# Patient Record
Sex: Female | Born: 1965 | Race: Black or African American | Hispanic: No | Marital: Married | Smoking: Never smoker
Health system: Southern US, Community
[De-identification: ages and names within clinical notes are randomized; demographics above are authoritative.]

## PROBLEM LIST (undated history)

## (undated) DIAGNOSIS — I10 Essential (primary) hypertension: Secondary | ICD-10-CM

## (undated) DIAGNOSIS — T7840XA Allergy, unspecified, initial encounter: Secondary | ICD-10-CM

## (undated) DIAGNOSIS — T8859XA Other complications of anesthesia, initial encounter: Secondary | ICD-10-CM

## (undated) DIAGNOSIS — D649 Anemia, unspecified: Secondary | ICD-10-CM

## (undated) HISTORY — PX: TUBAL LIGATION: SHX77

## (undated) HISTORY — DX: Anemia, unspecified: D64.9

## (undated) HISTORY — DX: Essential (primary) hypertension: I10

## (undated) HISTORY — PX: ROTATOR CUFF REPAIR: SHX139

## (undated) HISTORY — DX: Allergy, unspecified, initial encounter: T78.40XA

## (undated) HISTORY — PX: EYE SURGERY: SHX253

---

## 2003-02-09 ENCOUNTER — Ambulatory Visit (HOSPITAL_COMMUNITY): Admission: RE | Admit: 2003-02-09 | Discharge: 2003-02-09 | Payer: Self-pay | Admitting: Obstetrics & Gynecology

## 2005-03-22 ENCOUNTER — Ambulatory Visit: Payer: Self-pay | Admitting: Family Medicine

## 2007-02-26 ENCOUNTER — Ambulatory Visit (HOSPITAL_COMMUNITY): Admission: RE | Admit: 2007-02-26 | Discharge: 2007-02-26 | Payer: Self-pay | Admitting: Obstetrics & Gynecology

## 2008-02-04 ENCOUNTER — Emergency Department (HOSPITAL_COMMUNITY): Admission: EM | Admit: 2008-02-04 | Discharge: 2008-02-04 | Payer: Self-pay | Admitting: Emergency Medicine

## 2008-03-05 ENCOUNTER — Other Ambulatory Visit: Admission: RE | Admit: 2008-03-05 | Discharge: 2008-03-05 | Payer: Self-pay | Admitting: Obstetrics & Gynecology

## 2008-03-05 ENCOUNTER — Ambulatory Visit (HOSPITAL_COMMUNITY): Admission: RE | Admit: 2008-03-05 | Discharge: 2008-03-05 | Payer: Self-pay | Admitting: Obstetrics & Gynecology

## 2009-03-09 ENCOUNTER — Ambulatory Visit (HOSPITAL_COMMUNITY): Admission: RE | Admit: 2009-03-09 | Discharge: 2009-03-09 | Payer: Self-pay | Admitting: Obstetrics & Gynecology

## 2009-03-10 ENCOUNTER — Other Ambulatory Visit: Admission: RE | Admit: 2009-03-10 | Discharge: 2009-03-10 | Payer: Self-pay | Admitting: Obstetrics & Gynecology

## 2009-03-24 ENCOUNTER — Emergency Department (HOSPITAL_COMMUNITY): Admission: EM | Admit: 2009-03-24 | Discharge: 2009-03-24 | Payer: Self-pay | Admitting: Emergency Medicine

## 2010-03-14 ENCOUNTER — Ambulatory Visit (HOSPITAL_COMMUNITY): Admission: RE | Admit: 2010-03-14 | Discharge: 2010-03-14 | Payer: Self-pay | Admitting: Obstetrics & Gynecology

## 2010-04-06 ENCOUNTER — Other Ambulatory Visit: Admission: RE | Admit: 2010-04-06 | Discharge: 2010-04-06 | Payer: Self-pay | Admitting: Obstetrics & Gynecology

## 2011-03-02 ENCOUNTER — Other Ambulatory Visit: Payer: Self-pay | Admitting: Obstetrics & Gynecology

## 2011-03-02 DIAGNOSIS — Z139 Encounter for screening, unspecified: Secondary | ICD-10-CM

## 2011-03-02 NOTE — Op Note (Signed)
   NAME:  Rachel Pearson, Rachel Pearson                           ACCOUNT NO.:  [id number]   MEDICAL RECORD NO.:  [id number]                   PATIENT TYPE:  AMB   LOCATION:  DAY                                  FACILITY:  [facility]   PHYSICIAN:  Lazaro Arms, M.D.                DATE OF BIRTH:  09-10-1966   DATE OF PROCEDURE:  02/09/2003  DATE OF DISCHARGE:                                 OPERATIVE REPORT   PREOPERATIVE DIAGNOSES:  1. Multiparous female desires permanent sterilization.  2. Last child 61 years old.   POSTOPERATIVE DIAGNOSES:  1. Multiparous female desires permanent sterilization.  2. Last child 45 years old.   PROCEDURE:  Laparoscopic bilateral tubal ligation using electrocautery.   SURGEON:  Lazaro Arms, M.D.   ANESTHESIA:  General endotracheal   FINDINGS:  The patient had a normal uterus, tubes and ovaries, and  peritoneal cavity.   DESCRIPTION OF OPERATION:  The patient was taken to the operating room and  placed in the supine position where she underwent general endotracheal  anesthesia.  She was then placed in dorsal lithotomy position.  The vagina  was prepped and draped in the usual sterile fashion.  An in-and-out catheter  was performed.  A Hulka tenaculum was placed for uterine manipulation.   An incision was made in the umbilicus and dissected to the rectus fascia.  The rectus fascia was incised with the scissors, and manually entered the  peritoneum without difficulty under direct visualization.  The 10-11 trocar  was placed.  The peritoneal cavity was insufflated and confirmed with video  laparoscope.  Both tubes were identified, burned, and the distal isthmic and  ampullary region of each tube, to no resistance and beyond using  electrocautery unit.  The patient tolerated the procedure well. There was no  blood loss.  The instruments were removed.  The gas was allowed to escape.  The fascia was closed with a single #0 Vicryl suture and a 3-0 subcutaneous  suture was placed and the skin was closed using Dermabond.  The patient  tolerated the procedure well.  She experienced no blood loss, and was taken  to recovery in good stable condition.  All counts were correct x3.                                               Lazaro Arms, M.D.    Loraine Maple  D:  02/09/2003  T:  02/09/2003  Job:  [id number]

## 2011-03-19 ENCOUNTER — Ambulatory Visit (HOSPITAL_COMMUNITY)
Admission: RE | Admit: 2011-03-19 | Discharge: 2011-03-19 | Disposition: A | Discharge status: Home / Self Care | Payer: PRIVATE HEALTH INSURANCE | Source: Ambulatory Visit | Attending: Obstetrics & Gynecology | Admitting: Obstetrics & Gynecology

## 2011-03-19 DIAGNOSIS — Z1231 Encounter for screening mammogram for malignant neoplasm of breast: Secondary | ICD-10-CM | POA: Insufficient documentation

## 2011-03-19 DIAGNOSIS — Z139 Encounter for screening, unspecified: Secondary | ICD-10-CM

## 2011-05-14 ENCOUNTER — Other Ambulatory Visit: Payer: Self-pay | Admitting: Obstetrics & Gynecology

## 2011-05-14 ENCOUNTER — Other Ambulatory Visit (HOSPITAL_COMMUNITY)
Admission: RE | Admit: 2011-05-14 | Discharge: 2011-05-14 | Disposition: A | Discharge status: Home / Self Care | Payer: PRIVATE HEALTH INSURANCE | Source: Ambulatory Visit | Attending: Obstetrics & Gynecology | Admitting: Obstetrics & Gynecology

## 2011-05-14 DIAGNOSIS — Z01419 Encounter for gynecological examination (general) (routine) without abnormal findings: Secondary | ICD-10-CM | POA: Insufficient documentation

## 2012-02-11 ENCOUNTER — Other Ambulatory Visit: Payer: Self-pay | Admitting: Obstetrics & Gynecology

## 2012-02-11 DIAGNOSIS — Z139 Encounter for screening, unspecified: Secondary | ICD-10-CM

## 2012-03-20 ENCOUNTER — Ambulatory Visit (HOSPITAL_COMMUNITY)
Admission: RE | Admit: 2012-03-20 | Discharge: 2012-03-20 | Disposition: A | Discharge status: Home / Self Care | Payer: PRIVATE HEALTH INSURANCE | Source: Ambulatory Visit | Attending: Obstetrics & Gynecology | Admitting: Obstetrics & Gynecology

## 2012-03-20 DIAGNOSIS — Z1231 Encounter for screening mammogram for malignant neoplasm of breast: Secondary | ICD-10-CM | POA: Insufficient documentation

## 2012-03-20 DIAGNOSIS — Z139 Encounter for screening, unspecified: Secondary | ICD-10-CM

## 2012-06-09 ENCOUNTER — Other Ambulatory Visit: Payer: Self-pay | Admitting: Obstetrics & Gynecology

## 2012-06-09 ENCOUNTER — Other Ambulatory Visit (HOSPITAL_COMMUNITY)
Admission: RE | Admit: 2012-06-09 | Discharge: 2012-06-09 | Disposition: A | Discharge status: Home / Self Care | Payer: PRIVATE HEALTH INSURANCE | Source: Ambulatory Visit | Attending: Obstetrics & Gynecology | Admitting: Obstetrics & Gynecology

## 2012-06-09 DIAGNOSIS — Z01419 Encounter for gynecological examination (general) (routine) without abnormal findings: Secondary | ICD-10-CM | POA: Insufficient documentation

## 2013-05-18 ENCOUNTER — Other Ambulatory Visit: Payer: Self-pay | Admitting: Obstetrics & Gynecology

## 2013-05-18 DIAGNOSIS — Z139 Encounter for screening, unspecified: Secondary | ICD-10-CM

## 2013-05-28 ENCOUNTER — Ambulatory Visit (HOSPITAL_COMMUNITY)
Admission: RE | Admit: 2013-05-28 | Discharge: 2013-05-28 | Disposition: A | Discharge status: Home / Self Care | Payer: 59 | Source: Ambulatory Visit | Attending: Obstetrics & Gynecology | Admitting: Obstetrics & Gynecology

## 2013-05-28 DIAGNOSIS — Z1231 Encounter for screening mammogram for malignant neoplasm of breast: Secondary | ICD-10-CM | POA: Insufficient documentation

## 2013-05-28 DIAGNOSIS — Z139 Encounter for screening, unspecified: Secondary | ICD-10-CM

## 2013-06-10 ENCOUNTER — Other Ambulatory Visit: Payer: Self-pay | Admitting: Obstetrics & Gynecology

## 2013-07-09 ENCOUNTER — Other Ambulatory Visit (HOSPITAL_COMMUNITY)
Admission: RE | Admit: 2013-07-09 | Discharge: 2013-07-09 | Disposition: A | Discharge status: Home / Self Care | Payer: 59 | Source: Ambulatory Visit | Attending: Obstetrics & Gynecology | Admitting: Obstetrics & Gynecology

## 2013-07-09 ENCOUNTER — Ambulatory Visit (INDEPENDENT_AMBULATORY_CARE_PROVIDER_SITE_OTHER): Payer: 59 | Admitting: Obstetrics & Gynecology

## 2013-07-09 ENCOUNTER — Encounter: Payer: Self-pay | Admitting: Obstetrics & Gynecology

## 2013-07-09 VITALS — BP 110/70 | Ht 63.0 in | Wt 231.0 lb

## 2013-07-09 DIAGNOSIS — I10 Essential (primary) hypertension: Secondary | ICD-10-CM | POA: Insufficient documentation

## 2013-07-09 DIAGNOSIS — Z01419 Encounter for gynecological examination (general) (routine) without abnormal findings: Secondary | ICD-10-CM | POA: Insufficient documentation

## 2013-07-09 DIAGNOSIS — Z1151 Encounter for screening for human papillomavirus (HPV): Secondary | ICD-10-CM | POA: Insufficient documentation

## 2013-07-09 NOTE — Progress Notes (Signed)
Patient ID: Rachel Pearson, female   DOB: [birth year], 47 y.o.   MRN: [id number] Subjective:     Rachel Pearson is a 47 y.o. female here for a routine exam.  Patient's last menstrual period was 06/29/2013. No obstetric history on file. Current complaints: none.  Personal health questionnaire reviewed: no.   Gynecologic History Patient's last menstrual period was 06/29/2013. Contraception: tubal ligation Last Pap: 2013. Results were: normal Last mammogram: 2014. Results were: normal  Past Medical History  Diagnosis Date  . Allergy   . Hypertension   . Anemia     Past Surgical History  Procedure Laterality Date  . Tubal ligation      OB History   Grav Para Term Preterm Abortions TAB SAB Ect Mult Living                  History   Social History  . Marital Status: Married    Spouse Name: N/A    Number of Children: N/A  . Years of Education: N/A   Social History Main Topics  . Smoking status: Never Smoker   . Smokeless tobacco: None  . Alcohol Use: None  . Drug Use: None  . Sexual Activity: Yes   Other Topics Concern  . None   Social History Narrative  . None    Family History  Problem Relation Age of Onset  . Leukemia Mother   . Diabetes Mother   . Hypertension Mother   . Diabetes Father   . Hypertension Father   . Thyroid disease Sister   . Hypertension Brother   . Hypertension Brother      Review of Systems  Review of Systems  Constitutional: Negative for fever, chills, weight loss, malaise/fatigue and diaphoresis.  HENT: Negative for hearing loss, ear pain, nosebleeds, congestion, sore throat, neck pain, tinnitus and ear discharge.   Eyes: Negative for blurred vision, double vision, photophobia, pain, discharge and redness.  Respiratory: Negative for cough, hemoptysis, sputum production, shortness of breath, wheezing and stridor.   Cardiovascular: Negative for chest pain, palpitations, orthopnea, claudication, leg swelling and PND.   Gastrointestinal: negative for abdominal pain. Negative for heartburn, nausea, vomiting, diarrhea, constipation, blood in stool and melena.  Genitourinary: Negative for dysuria, urgency, frequency, hematuria and flank pain.  Musculoskeletal: Negative for myalgias, back pain, joint pain and falls.  Skin: Negative for itching and rash.  Neurological: Negative for dizziness, tingling, tremors, sensory change, speech change, focal weakness, seizures, loss of consciousness, weakness and headaches.  Endo/Heme/Allergies: Negative for environmental allergies and polydipsia. Does not bruise/bleed easily.  Psychiatric/Behavioral: Negative for depression, suicidal ideas, hallucinations, memory loss and substance abuse. The patient is not nervous/anxious and does not have insomnia.        Objective:    Physical Exam  Vitals reviewed. Constitutional: She is oriented to person, place, and time. She appears well-developed and well-nourished.  HENT:  Head: Normocephalic and atraumatic.        Right Ear: External ear normal.  Left Ear: External ear normal.  Nose: Nose normal.  Mouth/Throat: Oropharynx is clear and moist.  Eyes: Conjunctivae and EOM are normal. Pupils are equal, round, and reactive to light. Right eye exhibits no discharge. Left eye exhibits no discharge. No scleral icterus.  Neck: Normal range of motion. Neck supple. No tracheal deviation present. No thyromegaly present.  Cardiovascular: Normal rate, regular rhythm, normal heart sounds and intact distal pulses.  Exam reveals no gallop and no friction rub.   No murmur heard.  Respiratory: Effort normal and breath sounds normal. No respiratory distress. She has no wheezes. She has no rales. She exhibits no tenderness.  GI: Soft. Bowel sounds are normal. She exhibits no distension and no mass. There is no tenderness. There is no rebound and no guarding.  Genitourinary:  Breasts no masses skin changes or nipple changes bilaterally       Vulva is normal without lesions Vagina is pink moist without discharge Cervix normal in appearance and pap is done Uterus is normal size shape and contour Adnexa is negative with normal sized ovaries  Rectal   Not done  Musculoskeletal: Normal range of motion. She exhibits no edema and no tenderness.  Neurological: She is alert and oriented to person, place, and time. She has normal reflexes. She displays normal reflexes. No cranial nerve deficit. She exhibits normal muscle tone. Coordination normal.  Skin: Skin is warm and dry. No rash noted. No erythema. No pallor.  Psychiatric: She has a normal mood and affect. Her behavior is normal. Judgment and thought content normal.       Assessment:    Healthy female exam.    Plan:    Follow up in: 1 year.

## 2014-05-18 ENCOUNTER — Other Ambulatory Visit: Payer: Self-pay | Admitting: Obstetrics & Gynecology

## 2014-05-18 DIAGNOSIS — Z1231 Encounter for screening mammogram for malignant neoplasm of breast: Secondary | ICD-10-CM

## 2014-05-31 ENCOUNTER — Ambulatory Visit (HOSPITAL_COMMUNITY)
Admission: RE | Admit: 2014-05-31 | Discharge: 2014-05-31 | Disposition: A | Discharge status: Home / Self Care | Payer: 59 | Source: Ambulatory Visit | Attending: Obstetrics & Gynecology | Admitting: Obstetrics & Gynecology

## 2014-05-31 DIAGNOSIS — Z1231 Encounter for screening mammogram for malignant neoplasm of breast: Secondary | ICD-10-CM

## 2014-07-22 ENCOUNTER — Other Ambulatory Visit: Payer: 59 | Admitting: Obstetrics & Gynecology

## 2014-08-04 ENCOUNTER — Other Ambulatory Visit: Payer: 59 | Admitting: Obstetrics & Gynecology

## 2014-08-27 ENCOUNTER — Ambulatory Visit (INDEPENDENT_AMBULATORY_CARE_PROVIDER_SITE_OTHER): Payer: 59 | Admitting: Obstetrics & Gynecology

## 2014-08-27 ENCOUNTER — Other Ambulatory Visit (HOSPITAL_COMMUNITY)
Admission: RE | Admit: 2014-08-27 | Discharge: 2014-08-27 | Disposition: A | Discharge status: Home / Self Care | Payer: 59 | Source: Ambulatory Visit | Attending: Obstetrics & Gynecology | Admitting: Obstetrics & Gynecology

## 2014-08-27 ENCOUNTER — Encounter: Payer: Self-pay | Admitting: Obstetrics & Gynecology

## 2014-08-27 VITALS — BP 120/80 | Ht 63.0 in | Wt 231.0 lb

## 2014-08-27 DIAGNOSIS — Z01419 Encounter for gynecological examination (general) (routine) without abnormal findings: Secondary | ICD-10-CM | POA: Diagnosis present

## 2014-08-27 NOTE — Progress Notes (Signed)
Patient ID: Rachel Pearson, female   DOB: Feb 23, 1966, 48 y.o.   MRN: [id number] Subjective:     Rachel Pearson is a 48 y.o. female here for a routine exam.  Patient's last menstrual period was 08/05/2014. No obstetric history on file. Birth Control Method:  BTL Menstrual Calendar(currently): regular 7 days heavy  Current complaints: heavy periods, anemia secondarily.   Current acute medical issues:  Anemia, Fe deficiency   Recent Gynecologic History Patient's last menstrual period was 08/05/2014. Last Pap: 2014,  normal Last mammogram: 8.2015,  normal  Past Medical History  Diagnosis Date  . Allergy   . Hypertension   . Anemia     Past Surgical History  Procedure Laterality Date  . Tubal ligation      OB History    No data available      History   Social History  . Marital Status: Married    Spouse Name: N/A    Number of Children: N/A  . Years of Education: N/A   Social History Main Topics  . Smoking status: Never Smoker   . Smokeless tobacco: None  . Alcohol Use: None  . Drug Use: None  . Sexual Activity: Yes   Other Topics Concern  . None   Social History Narrative    Family History  Problem Relation Age of Onset  . Leukemia Mother   . Diabetes Mother   . Hypertension Mother   . Diabetes Father   . Hypertension Father   . Thyroid disease Sister   . Hypertension Brother   . Hypertension Brother     Current outpatient prescriptions: Cholecalciferol (VITAMIN D-3) 5000 UNITS TABS, Take by mouth., Disp: , Rfl: ;  ferrous fumarate (FERRO-SEQUELS) 50 MG CR tablet, Take 65 mg by mouth 2 (two) times daily between meals. , Disp: , Rfl: ;  fluticasone (FLONASE) 50 MCG/ACT nasal spray, Place 2 sprays into the nose daily., Disp: , Rfl: ;  losartan-hydrochlorothiazide (HYZAAR) 50-12.5 MG per tablet, Take 1 tablet by mouth daily., Disp: , Rfl:   Review of Systems  Review of Systems  Constitutional: Negative for fever, chills, weight loss, malaise/fatigue and  diaphoresis.  HENT: Negative for hearing loss, ear pain, nosebleeds, congestion, sore throat, neck pain, tinnitus and ear discharge.   Eyes: Negative for blurred vision, double vision, photophobia, pain, discharge and redness.  Respiratory: Negative for cough, hemoptysis, sputum production, shortness of breath, wheezing and stridor.   Cardiovascular: Negative for chest pain, palpitations, orthopnea, claudication, leg swelling and PND.  Gastrointestinal: negative for abdominal pain. Negative for heartburn, nausea, vomiting, diarrhea, constipation, blood in stool and melena.  Genitourinary: Negative for dysuria, urgency, frequency, hematuria and flank pain.  Musculoskeletal: Negative for myalgias, back pain, joint pain and falls.  Skin: Negative for itching and rash.  Neurological: Negative for dizziness, tingling, tremors, sensory change, speech change, focal weakness, seizures, loss of consciousness, weakness and headaches.  Endo/Heme/Allergies: Negative for environmental allergies and polydipsia. Does not bruise/bleed easily.  Psychiatric/Behavioral: Negative for depression, suicidal ideas, hallucinations, memory loss and substance abuse. The patient is not nervous/anxious and does not have insomnia.        Objective:  Blood pressure 120/80, height 5\' 3"  (1.6 m), weight 231 lb (104.781 kg), last menstrual period 08/05/2014.   Physical Exam  Vitals reviewed. Constitutional: She is oriented to person, place, and time. She appears well-developed and well-nourished.  HENT:  Head: Normocephalic and atraumatic.        Right Ear: External ear normal.  Left Ear: External ear normal.  Nose: Nose normal.  Mouth/Throat: Oropharynx is clear and moist.  Eyes: Conjunctivae and EOM are normal. Pupils are equal, round, and reactive to light. Right eye exhibits no discharge. Left eye exhibits no discharge. No scleral icterus.  Neck: Normal range of motion. Neck supple. No tracheal deviation present. No  thyromegaly present.  Cardiovascular: Normal rate, regular rhythm, normal heart sounds and intact distal pulses.  Exam reveals no gallop and no friction rub.   No murmur heard. Respiratory: Effort normal and breath sounds normal. No respiratory distress. She has no wheezes. She has no rales. She exhibits no tenderness.  GI: Soft. Bowel sounds are normal. She exhibits no distension and no mass. There is no tenderness. There is no rebound and no guarding.  Genitourinary:  Breasts no masses skin changes or nipple changes bilaterally      Vulva is normal without lesions Vagina is pink moist without discharge Cervix normal in appearance and pap is done Uterus is normal size shape and contour Adnexa is negative with normal sized ovaries   Musculoskeletal: Normal range of motion. She exhibits no edema and no tenderness.  Neurological: She is alert and oriented to person, place, and time. She has normal reflexes. She displays normal reflexes. No cranial nerve deficit. She exhibits normal muscle tone. Coordination normal.  Skin: Skin is warm and dry. No rash noted. No erythema. No pallor.  Psychiatric: She has a normal mood and affect. Her behavior is normal. Judgment and thought content normal.       Assessment:    Healthy female exam.   Menorrhagia anemia   Plan:    Follow up in: 3 weeks. IUD placement

## 2014-08-31 LAB — CYTOLOGY - PAP

## 2014-09-16 ENCOUNTER — Ambulatory Visit: Payer: 59 | Admitting: Obstetrics & Gynecology

## 2014-09-30 ENCOUNTER — Encounter: Payer: Self-pay | Admitting: Obstetrics & Gynecology

## 2014-09-30 ENCOUNTER — Ambulatory Visit (INDEPENDENT_AMBULATORY_CARE_PROVIDER_SITE_OTHER): Payer: 59 | Admitting: Obstetrics & Gynecology

## 2014-09-30 VITALS — BP 148/70 | Wt 236.0 lb

## 2014-09-30 DIAGNOSIS — N946 Dysmenorrhea, unspecified: Secondary | ICD-10-CM

## 2014-09-30 DIAGNOSIS — Z3202 Encounter for pregnancy test, result negative: Secondary | ICD-10-CM

## 2014-09-30 DIAGNOSIS — N920 Excessive and frequent menstruation with regular cycle: Secondary | ICD-10-CM

## 2014-09-30 LAB — POCT URINE PREGNANCY: Preg Test, Ur: NEGATIVE

## 2014-09-30 NOTE — Progress Notes (Signed)
Patient ID: Rachel Pearson, female   DOB: 12/08/1965, 48 y.o.   MRN: [id number] 48 y/o with heavy menstrual periods 7 days and associated pain S/P BTL for contraception levonorgestrel IUD placement to try to control menses  IUD Insertion Procedure Note  Pre-operative Diagnosis: menorrhagia, dysmenorrhea  Post-operative Diagnosis: same  Indications: menorrhagia, dysmenorrhea  Procedure Details  Urine pregnancy test was done today and result was negative.  The risks (including infection, bleeding, pain, and uterine perforation) and benefits of the procedure were explained to the patient and Verbal informed consent was obtained.    Cervix cleansed with Betadine. Uterus sounded to 8 cm. IUD inserted without difficulty. String visible and trimmed. Patient tolerated procedure well.  IUD Information: Liletta(levonorgestrel IUD)  Condition: Stable  Complications: None  Plan:  The patient was advised to call for any fever or for prolonged or severe pain or bleeding. She was advised to use OTC ibuprofen as needed for mild to moderate pain.   Attending Physician Documentation: I was present for or participated in the entire procedure, including opening and closing.  Lot # [id number] Exp 11/2017

## 2015-05-06 ENCOUNTER — Other Ambulatory Visit: Payer: Self-pay | Admitting: Obstetrics & Gynecology

## 2015-05-06 DIAGNOSIS — Z1231 Encounter for screening mammogram for malignant neoplasm of breast: Secondary | ICD-10-CM

## 2015-06-03 ENCOUNTER — Ambulatory Visit (HOSPITAL_COMMUNITY)
Admission: RE | Admit: 2015-06-03 | Discharge: 2015-06-03 | Disposition: A | Discharge status: Home / Self Care | Payer: BLUE CROSS/BLUE SHIELD | Source: Ambulatory Visit | Attending: Obstetrics & Gynecology | Admitting: Obstetrics & Gynecology

## 2015-06-03 DIAGNOSIS — Z1231 Encounter for screening mammogram for malignant neoplasm of breast: Secondary | ICD-10-CM | POA: Insufficient documentation

## 2015-07-27 ENCOUNTER — Ambulatory Visit (INDEPENDENT_AMBULATORY_CARE_PROVIDER_SITE_OTHER): Payer: BLUE CROSS/BLUE SHIELD | Admitting: *Deleted

## 2015-07-27 DIAGNOSIS — Z23 Encounter for immunization: Secondary | ICD-10-CM | POA: Diagnosis not present

## 2015-09-01 ENCOUNTER — Other Ambulatory Visit: Payer: 59 | Admitting: Obstetrics & Gynecology

## 2015-09-15 ENCOUNTER — Ambulatory Visit (INDEPENDENT_AMBULATORY_CARE_PROVIDER_SITE_OTHER): Payer: BLUE CROSS/BLUE SHIELD | Admitting: Advanced Practice Midwife

## 2015-09-15 ENCOUNTER — Encounter: Payer: Self-pay | Admitting: Advanced Practice Midwife

## 2015-09-15 VITALS — BP 120/80 | HR 60 | Ht 62.0 in | Wt 235.0 lb

## 2015-09-15 DIAGNOSIS — Z01419 Encounter for gynecological examination (general) (routine) without abnormal findings: Secondary | ICD-10-CM | POA: Diagnosis not present

## 2015-09-15 NOTE — Progress Notes (Signed)
Rachel Pearson 49 y.o.  Filed Vitals:   09/15/15 0835  BP: 120/80  Pulse: 60     Past Medical History: Past Medical History  Diagnosis Date  . Allergy   . Hypertension   . Anemia     Past Surgical History: Past Surgical History  Procedure Laterality Date  . Tubal ligation      Family History: Family History  Problem Relation Age of Onset  . Leukemia Mother   . Diabetes Mother   . Hypertension Mother   . Diabetes Father   . Hypertension Father   . Thyroid disease Sister   . Hypertension Brother   . Hypertension Brother     Social History: Social History  Substance Use Topics  . Smoking status: Never Smoker   . Smokeless tobacco: None  . Alcohol Use: None    Allergies: Not on File    Current outpatient prescriptions:  .  Cholecalciferol (VITAMIN D-3) 5000 UNITS TABS, Take by mouth., Disp: , Rfl:  .  ferrous fumarate (FERRO-SEQUELS) 50 MG CR tablet, Take 65 mg by mouth 2 (two) times daily between meals. , Disp: , Rfl:  .  fluticasone (FLONASE) 50 MCG/ACT nasal spray, Place 2 sprays into the nose daily., Disp: , Rfl:  .  gabapentin (NEURONTIN) 100 MG capsule, Take 100 mg by mouth 3 (three) times daily., Disp: , Rfl:  .  loratadine (CLARITIN) 10 MG tablet, Take 10 mg by mouth daily., Disp: , Rfl:  .  losartan-hydrochlorothiazide (HYZAAR) 50-12.5 MG per tablet, Take 1 tablet by mouth daily., Disp: , Rfl:  .  Multiple Vitamins-Minerals (CENTURY-VITE PO), Take by mouth., Disp: , Rfl:   History of Present Illness: Here for routine GYN exam.  Paps have always been normal, last pap 11/15.  Had Mirena placed last year d/t menorrhagia. She has had excellent results. Still has monthly period, very light, has skipped one. PCP watches labs. Mammogram 8/16 normal.    Review of Systems   Patient denies any headaches, blurred vision, shortness of breath, chest pain, abdominal pain, problems with bowel movements, urination, or intercourse.   Physical Exam: General:  Well  developed, well nourished, no acute distress Skin:  Warm and dry Neck:  Midline trachea, normal thyroid Lungs; Clear to auscultation bilaterally Breast:  No dominant palpable mass, retraction, or nipple discharge Cardiovascular: Regular rate and rhythm Abdomen:  Soft, non tender, no hepatosplenomegaly Pelvic:  External genitalia is normal in appearance.  The vagina is normal in appearance.  The cervix is bulbous.  Uterus is felt to be normal size, shape, and contour.  No adnexal masses or tenderness noted.  Extremities:  No swelling or varicosities noted Psych:  No mood changes.     Impression: Normal GYN exam     Plan: Colonoscopy age 49 Yearly mammograms Pap 08/2017

## 2016-02-29 DIAGNOSIS — D649 Anemia, unspecified: Secondary | ICD-10-CM | POA: Diagnosis not present

## 2016-06-07 ENCOUNTER — Other Ambulatory Visit: Payer: Self-pay | Admitting: Obstetrics & Gynecology

## 2016-06-07 DIAGNOSIS — Z1231 Encounter for screening mammogram for malignant neoplasm of breast: Secondary | ICD-10-CM

## 2016-06-15 ENCOUNTER — Ambulatory Visit (HOSPITAL_COMMUNITY)
Admission: RE | Admit: 2016-06-15 | Discharge: 2016-06-15 | Disposition: A | Discharge status: Home / Self Care | Payer: BLUE CROSS/BLUE SHIELD | Source: Ambulatory Visit | Attending: Obstetrics & Gynecology | Admitting: Obstetrics & Gynecology

## 2016-06-15 DIAGNOSIS — Z1231 Encounter for screening mammogram for malignant neoplasm of breast: Secondary | ICD-10-CM

## 2016-07-16 ENCOUNTER — Ambulatory Visit (INDEPENDENT_AMBULATORY_CARE_PROVIDER_SITE_OTHER): Payer: BLUE CROSS/BLUE SHIELD

## 2016-07-16 DIAGNOSIS — Z23 Encounter for immunization: Secondary | ICD-10-CM | POA: Diagnosis not present

## 2016-09-26 ENCOUNTER — Telehealth: Payer: Self-pay | Admitting: Physician Assistant

## 2016-09-26 ENCOUNTER — Other Ambulatory Visit: Payer: BLUE CROSS/BLUE SHIELD | Admitting: Physician Assistant

## 2016-09-26 MED ORDER — LOSARTAN POTASSIUM-HCTZ 50-12.5 MG PO TABS: 1.0000 | ORAL_TABLET | Freq: Every day | ORAL | 11 refills | Status: DC

## 2016-09-26 NOTE — Telephone Encounter (Signed)
Prescription sent to pharmacy.

## 2016-10-11 ENCOUNTER — Encounter: Payer: Self-pay | Admitting: Physician Assistant

## 2016-10-11 ENCOUNTER — Ambulatory Visit (INDEPENDENT_AMBULATORY_CARE_PROVIDER_SITE_OTHER): Payer: BLUE CROSS/BLUE SHIELD | Admitting: Physician Assistant

## 2016-10-11 ENCOUNTER — Other Ambulatory Visit: Payer: Self-pay | Admitting: *Deleted

## 2016-10-11 VITALS — BP 124/75 | HR 89 | Temp 98.7°F | Ht 62.0 in | Wt 235.0 lb

## 2016-10-11 DIAGNOSIS — Z124 Encounter for screening for malignant neoplasm of cervix: Secondary | ICD-10-CM | POA: Diagnosis not present

## 2016-10-11 DIAGNOSIS — Z0000 Encounter for general adult medical examination without abnormal findings: Secondary | ICD-10-CM | POA: Diagnosis not present

## 2016-10-11 MED ORDER — GABAPENTIN 100 MG PO CAPS: 100.0000 mg | ORAL_CAPSULE | Freq: Three times a day (TID) | ORAL | 11 refills | Status: DC

## 2016-10-11 MED ORDER — FLUTICASONE PROPIONATE 50 MCG/ACT NA SUSP: 2.0000 | Freq: Every day | NASAL | 11 refills | Status: DC

## 2016-10-11 MED ORDER — LORATADINE 10 MG PO TABS: 10.0000 mg | ORAL_TABLET | Freq: Every day | ORAL | 11 refills | Status: DC

## 2016-10-11 NOTE — Patient Instructions (Signed)

## 2016-10-12 LAB — PAP IG W/ RFLX HPV ASCU: PAP SMEAR COMMENT: 0

## 2016-10-16 ENCOUNTER — Other Ambulatory Visit: Payer: BLUE CROSS/BLUE SHIELD

## 2016-10-16 DIAGNOSIS — Z0000 Encounter for general adult medical examination without abnormal findings: Secondary | ICD-10-CM | POA: Diagnosis not present

## 2016-10-16 NOTE — Progress Notes (Signed)
BP 124/75   Pulse 89   Temp 98.7 F (37.1 C) (Oral)   Ht _0  (1.575 m)   Wt 235 lb (106.6 kg)   LMP 09/27/2016 (Approximate)   BMI 42.98 kg/m    Subjective:    Patient ID: Rachel Pearson, female    DOB: 12-20-1965, 51 y.o.   MRN: [id number]  HPI: Rachel Pearson is a 51 y.o. female presenting on 10/11/2016 for Annual Exam (pt here today for CPE/pap, no complaints at this time.)  This patient comes in for annual well physical examination. All medications are reviewed today. There are no reports of any problems with the medications. All of the medical conditions are reviewed and updated.  Lab work is reviewed and will be ordered as medically necessary. There are no new problems reported with today's visit.  Patient reports doing well overall.   Past Medical History:  Diagnosis Date  . Allergy   . Anemia   . Hypertension    Relevant past medical, surgical, family and social history reviewed and updated as indicated. Interim medical history since our last visit reviewed. Allergies and medications reviewed and updated. DATA REVIEWED: CHART IN EPIC  Social History   Social History  . Marital status: Married    Spouse name: N/A  . Number of children: N/A  . Years of education: N/A   Occupational History  . Not on file.   Social History Main Topics  . Smoking status: Never Smoker  . Smokeless tobacco: Never Used  . Alcohol use Not on file  . Drug use: Unknown  . Sexual activity: Yes   Other Topics Concern  . Not on file   Social History Narrative  . No narrative on file    Past Surgical History:  Procedure Laterality Date  . TUBAL LIGATION      Family History  Problem Relation Age of Onset  . Leukemia Mother   . Diabetes Mother   . Hypertension Mother   . Diabetes Father   . Hypertension Father   . Thyroid disease Sister   . Hypertension Brother   . Hypertension Brother     Review of Systems  Constitutional: Negative.  Negative for activity change,  fatigue and fever.  HENT: Negative.   Eyes: Negative.   Respiratory: Negative.  Negative for cough.   Cardiovascular: Negative.  Negative for chest pain.  Gastrointestinal: Negative.  Negative for abdominal pain.  Endocrine: Negative.   Genitourinary: Negative.  Negative for dysuria.  Musculoskeletal: Negative.   Skin: Negative.   Neurological: Negative.     Allergies as of 10/11/2016   No Known Allergies     Medication List       Accurate as of 10/11/16 11:59 PM. Always use your most recent med list.          CENTURY-VITE PO Take by mouth.   ferrous fumarate 50 MG CR tablet Commonly known as:  FERRO-SEQUELS Take 65 mg by mouth 2 (two) times daily between meals.   fluticasone 50 MCG/ACT nasal spray Commonly known as:  FLONASE Place 2 sprays into both nostrils daily.   gabapentin 100 MG capsule Commonly known as:  NEURONTIN Take 1 capsule (100 mg total) by mouth 3 (three) times daily.   loratadine 10 MG tablet Commonly known as:  CLARITIN Take 1 tablet (10 mg total) by mouth daily.   losartan-hydrochlorothiazide 50-12.5 MG tablet Commonly known as:  HYZAAR Take 1 tablet by mouth daily.   Vitamin D-3 5000 units  Tabs Take by mouth.          Objective:    BP 124/75   Pulse 89   Temp 98.7 F (37.1 C) (Oral)   Ht _0  (1.575 m)   Wt 235 lb (106.6 kg)   LMP 09/27/2016 (Approximate)   BMI 42.98 kg/m   No Known Allergies  Wt Readings from Last 3 Encounters:  10/11/16 235 lb (106.6 kg)  09/15/15 235 lb (106.6 kg)  09/30/14 236 lb (107 kg)    Physical Exam  Constitutional: She is oriented to person, place, and time. She appears well-developed and well-nourished.  HENT:  Head: Normocephalic and atraumatic.  Eyes: Conjunctivae and EOM are normal. Pupils are equal, round, and reactive to light.  Neck: Normal range of motion. Neck supple.  Cardiovascular: Normal rate, regular rhythm, normal heart sounds and intact distal pulses.   Pulmonary/Chest:  Effort normal and breath sounds normal. Right breast exhibits no mass, no skin change and no tenderness. Left breast exhibits no mass, no skin change and no tenderness. Breasts are symmetrical.  Abdominal: Soft. Bowel sounds are normal.  Genitourinary: Vagina normal and uterus normal. Rectal exam shows no fissure. No breast swelling, tenderness, discharge or bleeding. There is no tenderness or lesion on the right labia. There is no tenderness or lesion on the left labia. Uterus is not deviated, not enlarged and not tender. Cervix exhibits no motion tenderness, no discharge and no friability. Right adnexum displays no mass, no tenderness and no fullness. Left adnexum displays no mass, no tenderness and no fullness. No tenderness or bleeding in the vagina. No vaginal discharge found.  Neurological: She is alert and oriented to person, place, and time. She has normal reflexes.  Skin: Skin is warm and dry. No rash noted.  Psychiatric: She has a normal mood and affect. Her behavior is normal. Judgment and thought content normal.  Nursing note and vitals reviewed.   Results for orders placed or performed in visit on 10/11/16  Pap IG w/ reflex to HPV when ASC-U  Result Value Ref Range   DIAGNOSIS: Comment    Specimen adequacy: Comment    CLINICIAN PROVIDED ICD10: Comment    Performed by: Comment    PAP SMEAR COMMENT .    Note: Comment    Test Methodology Comment    PAP REFLEX: Comment       Assessment & Plan:   1. Well adult exam - loratadine (CLARITIN) 10 MG tablet; Take 1 tablet (10 mg total) by mouth daily.  Dispense: 30 tablet; Refill: 11 - fluticasone (FLONASE) 50 MCG/ACT nasal spray; Place 2 sprays into both nostrils daily.  Dispense: 16 g; Refill: 11 - gabapentin (NEURONTIN) 100 MG capsule; Take 1 capsule (100 mg total) by mouth 3 (three) times daily.  Dispense: 90 capsule; Refill: 11 - CBC with Differential/Platelet; Future - CMP14+EGFR; Future - Lipid panel; Future  2. Papanicolaou  smear for cervical cancer screening - Pap IG w/ reflex to HPV when ASC-U   Continue all other maintenance medications as listed above.  Follow up plan: Return in about 1 year (around 10/11/2017) for well.  Orders Placed This Encounter  Procedures  . CBC with Differential/Platelet  . CMP14+EGFR  . Lipid panel    Educational handout given for health maintenance  Terald Sleeper PA-C [facility] [address]  [place], [place] [address] [phone]   10/16/2016, 2:37 PM

## 2016-10-17 LAB — CBC WITH DIFFERENTIAL/PLATELET
Basophils Absolute: 0 10*3/uL (ref 0.0–0.2)
Basos: 0 %
EOS (ABSOLUTE): 0.3 10*3/uL (ref 0.0–0.4)
EOS: 3 %
HEMOGLOBIN: 11 g/dL — AB (ref 11.1–15.9)
Hematocrit: 35.2 % (ref 34.0–46.6)
Immature Grans (Abs): 0 10*3/uL (ref 0.0–0.1)
Immature Granulocytes: 0 %
LYMPHS ABS: 2.8 10*3/uL (ref 0.7–3.1)
Lymphs: 25 %
MCH: 22.5 pg — AB (ref 26.6–33.0)
MCHC: 31.3 g/dL — ABNORMAL LOW (ref 31.5–35.7)
MCV: 72 fL — ABNORMAL LOW (ref 79–97)
MONOCYTES: 11 %
Monocytes Absolute: 1.2 10*3/uL — ABNORMAL HIGH (ref 0.1–0.9)
NEUTROS ABS: 7 10*3/uL (ref 1.4–7.0)
Neutrophils: 61 %
Platelets: 395 10*3/uL — ABNORMAL HIGH (ref 150–379)
RBC: 4.88 x10E6/uL (ref 3.77–5.28)
RDW: 15.3 % (ref 12.3–15.4)
WBC: 11.4 10*3/uL — AB (ref 3.4–10.8)

## 2016-10-17 LAB — LIPID PANEL
CHOL/HDL RATIO: 3.4 ratio (ref 0.0–4.4)
Cholesterol, Total: 190 mg/dL (ref 100–199)
HDL: 56 mg/dL (ref >39)
LDL CALC: 121 mg/dL — AB (ref 0–99)
Triglycerides: 65 mg/dL (ref 0–149)
VLDL CHOLESTEROL CAL: 13 mg/dL (ref 5–40)

## 2016-10-17 LAB — CMP14+EGFR
ALBUMIN: 3.9 g/dL (ref 3.5–5.5)
ALT: 13 IU/L (ref 0–32)
AST: 13 IU/L (ref 0–40)
Albumin/Globulin Ratio: 1 — ABNORMAL LOW (ref 1.2–2.2)
Alkaline Phosphatase: 81 IU/L (ref 39–117)
BILIRUBIN TOTAL: 0.4 mg/dL (ref 0.0–1.2)
BUN / CREAT RATIO: 21 (ref 9–23)
BUN: 14 mg/dL (ref 6–24)
CO2: 25 mmol/L (ref 18–29)
CREATININE: 0.66 mg/dL (ref 0.57–1.00)
Calcium: 9.1 mg/dL (ref 8.7–10.2)
Chloride: 99 mmol/L (ref 96–106)
GFR, EST AFRICAN AMERICAN: 119 mL/min/{1.73_m2} (ref >59)
GFR, EST NON AFRICAN AMERICAN: 103 mL/min/{1.73_m2} (ref >59)
Globulin, Total: 3.9 g/dL (ref 1.5–4.5)
Glucose: 100 mg/dL — ABNORMAL HIGH (ref 65–99)
Potassium: 4.2 mmol/L (ref 3.5–5.2)
Sodium: 141 mmol/L (ref 134–144)
TOTAL PROTEIN: 7.8 g/dL (ref 6.0–8.5)

## 2017-02-28 DIAGNOSIS — H2511 Age-related nuclear cataract, right eye: Secondary | ICD-10-CM | POA: Diagnosis not present

## 2017-04-03 DIAGNOSIS — H26492 Other secondary cataract, left eye: Secondary | ICD-10-CM | POA: Diagnosis not present

## 2017-04-03 DIAGNOSIS — H25811 Combined forms of age-related cataract, right eye: Secondary | ICD-10-CM | POA: Diagnosis not present

## 2017-04-03 DIAGNOSIS — Z961 Presence of intraocular lens: Secondary | ICD-10-CM | POA: Diagnosis not present

## 2017-05-03 DIAGNOSIS — H2511 Age-related nuclear cataract, right eye: Secondary | ICD-10-CM | POA: Diagnosis not present

## 2017-05-03 DIAGNOSIS — H52221 Regular astigmatism, right eye: Secondary | ICD-10-CM | POA: Diagnosis not present

## 2017-05-03 DIAGNOSIS — H25041 Posterior subcapsular polar age-related cataract, right eye: Secondary | ICD-10-CM | POA: Diagnosis not present

## 2017-05-03 DIAGNOSIS — H25011 Cortical age-related cataract, right eye: Secondary | ICD-10-CM | POA: Diagnosis not present

## 2017-05-16 DIAGNOSIS — H2511 Age-related nuclear cataract, right eye: Secondary | ICD-10-CM | POA: Diagnosis not present

## 2017-05-16 DIAGNOSIS — H25011 Cortical age-related cataract, right eye: Secondary | ICD-10-CM | POA: Diagnosis not present

## 2017-06-05 DIAGNOSIS — H25011 Cortical age-related cataract, right eye: Secondary | ICD-10-CM | POA: Diagnosis not present

## 2017-06-05 DIAGNOSIS — Z79899 Other long term (current) drug therapy: Secondary | ICD-10-CM | POA: Diagnosis not present

## 2017-06-05 DIAGNOSIS — I10 Essential (primary) hypertension: Secondary | ICD-10-CM | POA: Diagnosis not present

## 2017-06-05 DIAGNOSIS — H2511 Age-related nuclear cataract, right eye: Secondary | ICD-10-CM | POA: Diagnosis not present

## 2017-06-05 DIAGNOSIS — H25041 Posterior subcapsular polar age-related cataract, right eye: Secondary | ICD-10-CM | POA: Diagnosis not present

## 2017-06-05 DIAGNOSIS — H52221 Regular astigmatism, right eye: Secondary | ICD-10-CM | POA: Insufficient documentation

## 2017-06-25 ENCOUNTER — Other Ambulatory Visit: Payer: Self-pay | Admitting: Physician Assistant

## 2017-06-25 DIAGNOSIS — Z1231 Encounter for screening mammogram for malignant neoplasm of breast: Secondary | ICD-10-CM

## 2017-07-04 ENCOUNTER — Ambulatory Visit (HOSPITAL_COMMUNITY)
Admission: RE | Admit: 2017-07-04 | Discharge: 2017-07-04 | Disposition: A | Discharge status: Home / Self Care | Payer: BLUE CROSS/BLUE SHIELD | Source: Ambulatory Visit | Attending: Physician Assistant | Admitting: Physician Assistant

## 2017-07-04 DIAGNOSIS — Z1231 Encounter for screening mammogram for malignant neoplasm of breast: Secondary | ICD-10-CM | POA: Diagnosis not present

## 2017-07-16 ENCOUNTER — Encounter (HOSPITAL_COMMUNITY): Payer: Self-pay | Admitting: *Deleted

## 2017-07-16 ENCOUNTER — Emergency Department (HOSPITAL_COMMUNITY)
Admission: EM | Admit: 2017-07-16 | Discharge: 2017-07-16 | Disposition: A | Discharge status: Home / Self Care | Payer: Worker's Compensation | Attending: Emergency Medicine | Admitting: Emergency Medicine

## 2017-07-16 DIAGNOSIS — M25512 Pain in left shoulder: Secondary | ICD-10-CM | POA: Diagnosis present

## 2017-07-16 DIAGNOSIS — Z79899 Other long term (current) drug therapy: Secondary | ICD-10-CM | POA: Insufficient documentation

## 2017-07-16 DIAGNOSIS — I10 Essential (primary) hypertension: Secondary | ICD-10-CM | POA: Insufficient documentation

## 2017-07-16 DIAGNOSIS — D649 Anemia, unspecified: Secondary | ICD-10-CM | POA: Insufficient documentation

## 2017-07-16 MED ORDER — DICLOFENAC SODIUM 75 MG PO TBEC: 75.0000 mg | DELAYED_RELEASE_TABLET | Freq: Two times a day (BID) | ORAL | 0 refills | Status: DC

## 2017-07-16 MED ORDER — PROMETHAZINE HCL 12.5 MG PO TABS
12.5000 mg | ORAL_TABLET | Freq: Once | ORAL | Status: AC
  Administered 2017-07-16: 12.5 mg via ORAL
  Filled 2017-07-16: qty 1

## 2017-07-16 MED ORDER — TRAMADOL HCL 50 MG PO TABS
100.0000 mg | ORAL_TABLET | Freq: Once | ORAL | Status: AC
  Administered 2017-07-16: 100 mg via ORAL
  Filled 2017-07-16: qty 2

## 2017-07-16 MED ORDER — KETOROLAC TROMETHAMINE 10 MG PO TABS
10.0000 mg | ORAL_TABLET | Freq: Once | ORAL | Status: AC
  Administered 2017-07-16: 10 mg via ORAL
  Filled 2017-07-16: qty 1

## 2017-07-16 MED ORDER — DEXAMETHASONE 4 MG PO TABS: 4.0000 mg | ORAL_TABLET | Freq: Two times a day (BID) | ORAL | 0 refills | Status: DC

## 2017-07-16 MED ORDER — TRAMADOL HCL 50 MG PO TABS: 50.0000 mg | ORAL_TABLET | Freq: Four times a day (QID) | ORAL | 0 refills | Status: DC | PRN

## 2017-07-16 NOTE — ED Triage Notes (Signed)
LEFT SHOULDER INJURY 07-05-17

## 2017-07-16 NOTE — Discharge Instructions (Signed)
Please use your sling when up and about. You do not need to sleep in this device. Please use diclofenac 2 times daily with food. May use Ultram for more severe pain. Please use Decadron 2 times daily with food. Heating pad to your shoulder may be helpful. Please call Dr. Ophelia Charter and set up an appointment concerning your shoulder is some is possible.

## 2017-07-16 NOTE — ED Provider Notes (Signed)
[facility] DEPT Provider Note   CSN: [id number] Arrival date & time: 07/16/17  2014     History   Chief Complaint Chief Complaint  Patient presents with  . Shoulder Injury    HPI Rachel Pearson is a 51 y.o. female.   Shoulder Pain   This is a new problem. The current episode started more than 1 week ago. The problem occurs daily. The problem has been gradually worsening. The pain is present in the left shoulder. The pain is moderate. Associated symptoms include limited range of motion and stiffness. Pertinent negatives include no numbness. Exacerbated by: certain positions, lifting. She has tried OTC pain medications for the symptoms. The treatment provided no relief. There has been a history of trauma.    Past Medical History:  Diagnosis Date  . Allergy   . Anemia   . Hypertension     Patient Active Problem List   Diagnosis Date Noted  . Hypertension 07/09/2013    Past Surgical History:  Procedure Laterality Date  . EYE SURGERY    . TUBAL LIGATION      OB History    No data available       Home Medications    Prior to Admission medications   Medication Sig Start Date End Date Taking? Authorizing Provider  Cholecalciferol (VITAMIN D-3) 5000 UNITS TABS Take by mouth.    [provider]  dexamethasone (DECADRON) 4 MG tablet Take 1 tablet (4 mg total) by mouth 2 (two) times daily with a meal. 07/16/17   Ivery Quale, PA-C  diclofenac (VOLTAREN) 75 MG EC tablet Take 1 tablet (75 mg total) by mouth 2 (two) times daily. 07/16/17   Ivery Quale, PA-C  ferrous fumarate (FERRO-SEQUELS) 50 MG CR tablet Take 65 mg by mouth 2 (two) times daily between meals.     [provider]  fluticasone (FLONASE) 50 MCG/ACT nasal spray Place 2 sprays into both nostrils daily. 10/11/16   Remus Loffler, PA-C  gabapentin (NEURONTIN) 100 MG capsule Take 1 capsule (100 mg total) by mouth 3 (three) times daily. 10/11/16   Remus Loffler, PA-C  loratadine (CLARITIN)  10 MG tablet Take 1 tablet (10 mg total) by mouth daily. 10/11/16   Remus Loffler, PA-C  losartan-hydrochlorothiazide (HYZAAR) 50-12.5 MG tablet Take 1 tablet by mouth daily. 09/26/16   Remus Loffler, PA-C  Multiple Vitamins-Minerals (CENTURY-VITE PO) Take by mouth.    [provider]  traMADol (ULTRAM) 50 MG tablet Take 1 tablet (50 mg total) by mouth every 6 (six) hours as needed. 07/16/17   Ivery Quale, PA-C    Family History Family History  Problem Relation Age of Onset  . Leukemia Mother   . Diabetes Mother   . Hypertension Mother   . Diabetes Father   . Hypertension Father   . Thyroid disease Sister   . Hypertension Brother   . Hypertension Brother     Social History Social History  Substance Use Topics  . Smoking status: Never Smoker  . Smokeless tobacco: Never Used  . Alcohol use No     Allergies   Patient has no known allergies.   Review of Systems Review of Systems  Constitutional: Negative for activity change.       All ROS Neg except as noted in HPI  HENT: Negative for nosebleeds.   Eyes: Negative for photophobia and discharge.  Respiratory: Negative for cough, shortness of breath and wheezing.   Cardiovascular: Negative for chest pain and palpitations.  Gastrointestinal: Negative for abdominal pain and blood in stool.  Genitourinary: Negative for dysuria, frequency and hematuria.  Musculoskeletal: Positive for arthralgias and stiffness. Negative for back pain and neck pain.  Skin: Negative.   Neurological: Negative for dizziness, seizures, speech difficulty and numbness.  Psychiatric/Behavioral: Negative for confusion and hallucinations.     Physical Exam Updated Vital Signs BP 130/76   Pulse 90   Temp 98.1 F (36.7 C)   Resp 18   Wt 107.5 kg (237 lb)   LMP 06/24/2017   SpO2 99%   BMI 43.35 kg/m   Physical Exam  Constitutional: She is oriented to person, place, and time. She appears well-developed and well-nourished.  Non-toxic  appearance.  HENT:  Head: Normocephalic.  Right Ear: Tympanic membrane and external ear normal.  Left Ear: Tympanic membrane and external ear normal.  Eyes: Pupils are equal, round, and reactive to light. EOM and lids are normal.  Neck: Normal range of motion. Neck supple. Carotid bruit is not present.  Cardiovascular: Normal rate, regular rhythm, normal heart sounds, intact distal pulses and normal pulses.   Pulmonary/Chest: Breath sounds normal. No respiratory distress.  Abdominal: Soft. Bowel sounds are normal. There is no tenderness. There is no guarding.  Musculoskeletal:       Left shoulder: She exhibits decreased range of motion and pain. She exhibits no swelling, no effusion and no deformity.  Radial pulses 2+ bilaterally. Capillary refill is less than 2 seconds bilaterally. There no temperature changes of the upper or lower extremities.  There is decreased range of motion of the left shoulder. There is pain with resistance exercises to both adduction and Abduction.  Lymphadenopathy:       Head (right side): No submandibular adenopathy present.       Head (left side): No submandibular adenopathy present.    She has no cervical adenopathy.  Neurological: She is alert and oriented to person, place, and time. She has normal strength. No cranial nerve deficit or sensory deficit.  Skin: Skin is warm and dry.  Psychiatric: She has a normal mood and affect. Her speech is normal.  Nursing note and vitals reviewed.    ED Treatments / Results  Labs (all labs ordered are listed, but only abnormal results are displayed) Labs Reviewed - No data to display  EKG  EKG Interpretation None       Radiology No results found.  Procedures Procedures (including critical care time)  Medications Ordered in ED Medications  ketorolac (TORADOL) tablet 10 mg (not administered)  traMADol (ULTRAM) tablet 100 mg (not administered)  promethazine (PHENERGAN) tablet 12.5 mg (not administered)      Initial Impression / Assessment and Plan / ED Course  I have reviewed the triage vital signs and the nursing notes.  Pertinent labs & imaging results that were available during my care of the patient were reviewed by me and considered in my medical decision making (see chart for details).       Final Clinical Impressions(s) / ED Diagnoses MDM Vital signs within normal limits. No acute neurovascular deficits of the upper extremities. Examination suggested strain/sprain of the shoulder, and or rotator cuff injury. Patient placed in a sling. Prescription for Decadron, diclofenac, and Ultram given to the patient. Patient referred to orthopedics.    Final diagnoses:  Acute pain of left shoulder    New Prescriptions New Prescriptions   DEXAMETHASONE (DECADRON) 4 MG TABLET    Take 1 tablet (4 mg total) by mouth 2 (two) times daily  with a meal.   DICLOFENAC (VOLTAREN) 75 MG EC TABLET    Take 1 tablet (75 mg total) by mouth 2 (two) times daily.   TRAMADOL (ULTRAM) 50 MG TABLET    Take 1 tablet (50 mg total) by mouth every 6 (six) hours as needed.     Ivery Quale, PA-C 07/17/17 1611    Maia Plan, MD 07/18/17 [phone]

## 2017-09-25 ENCOUNTER — Other Ambulatory Visit: Payer: Self-pay | Admitting: Physician Assistant

## 2017-09-25 NOTE — Telephone Encounter (Signed)
OV 10/18/17

## 2017-10-18 ENCOUNTER — Ambulatory Visit (INDEPENDENT_AMBULATORY_CARE_PROVIDER_SITE_OTHER): Payer: BLUE CROSS/BLUE SHIELD | Admitting: Physician Assistant

## 2017-10-18 ENCOUNTER — Encounter: Payer: Self-pay | Admitting: Physician Assistant

## 2017-10-18 DIAGNOSIS — Z0000 Encounter for general adult medical examination without abnormal findings: Secondary | ICD-10-CM

## 2017-10-18 DIAGNOSIS — Z23 Encounter for immunization: Secondary | ICD-10-CM | POA: Diagnosis not present

## 2017-10-18 MED ORDER — FLUTICASONE PROPIONATE 50 MCG/ACT NA SUSP: 2.0000 | Freq: Every day | NASAL | 11 refills | Status: DC

## 2017-10-18 MED ORDER — LOSARTAN POTASSIUM-HCTZ 50-12.5 MG PO TABS: 1.0000 | ORAL_TABLET | Freq: Every day | ORAL | 11 refills | Status: DC

## 2017-10-18 MED ORDER — LORATADINE 10 MG PO TABS: 10.0000 mg | ORAL_TABLET | Freq: Every day | ORAL | 11 refills | Status: DC

## 2017-10-18 NOTE — Patient Instructions (Signed)
In a few days you may receive a survey in the mail or online from [employer] regarding your visit with us today. Please take a moment to fill this out. Your feedback is very important to our whole office. It can help us better understand your needs as well as improve your experience and satisfaction. Thank you for taking your time to complete it. We care about you.  Sharesa Kemp, PA-C  

## 2017-10-21 NOTE — Progress Notes (Signed)
BP 126/79   Pulse 80   Temp 97.9 F (36.6 C) (Oral)   Ht '5\' 2"'  (1.575 m)   Wt 241 lb (109.3 kg)   BMI 44.08 kg/m    Subjective:    Patient ID: Rachel Pearson, female    DOB: [birth year], 52 y.o.   MRN: [id number]  HPI: Rachel Pearson is a 52 y.o. female presenting on 10/18/2017 for Annual Exam  This patient comes in for annual well physical examination. All medications are reviewed today. There are no reports of any problems with the medications. All of the medical conditions are reviewed and updated.  Lab work is reviewed and will be ordered as medically necessary. There are no new problems reported with today's visit.  Patient reports doing well overall.  Relevant past medical, surgical, family and social history reviewed and updated as indicated. Allergies and medications reviewed and updated.  Past Medical History:  Diagnosis Date  . Allergy   . Anemia   . Hypertension     Past Surgical History:  Procedure Laterality Date  . EYE SURGERY    . TUBAL LIGATION      Review of Systems  Constitutional: Negative.  Negative for activity change, fatigue and fever.  HENT: Negative.   Eyes: Negative.   Respiratory: Negative.  Negative for cough.   Cardiovascular: Negative.  Negative for chest pain.  Gastrointestinal: Negative.  Negative for abdominal pain.  Endocrine: Negative.   Genitourinary: Negative.  Negative for dysuria.  Musculoskeletal: Negative.   Skin: Negative.   Neurological: Negative.     Allergies as of 10/18/2017   No Known Allergies     Medication List        Accurate as of 10/18/17 11:59 PM. Always use your most recent med list.          CENTURY-VITE PO Take by mouth.   ferrous fumarate 50 MG CR tablet Commonly known as:  FERRO-SEQUELS Take 65 mg by mouth 2 (two) times daily between meals.   fluticasone 50 MCG/ACT nasal spray Commonly known as:  FLONASE Place 2 sprays into both nostrils daily.   gabapentin 100 MG capsule Commonly known as:   NEURONTIN Take 1 capsule (100 mg total) by mouth 3 (three) times daily.   loratadine 10 MG tablet Commonly known as:  CLARITIN Take 1 tablet (10 mg total) by mouth daily.   losartan-hydrochlorothiazide 50-12.5 MG tablet Commonly known as:  HYZAAR Take 1 tablet by mouth daily.   Vitamin D-3 5000 units Tabs Take by mouth.          Objective:    BP 126/79   Pulse 80   Temp 97.9 F (36.6 C) (Oral)   Ht '5\' 2"'  (1.575 m)   Wt 241 lb (109.3 kg)   BMI 44.08 kg/m   No Known Allergies  Physical Exam  Constitutional: She is oriented to person, place, and time. She appears well-developed and well-nourished.  HENT:  Head: Normocephalic and atraumatic.  Eyes: Conjunctivae and EOM are normal. Pupils are equal, round, and reactive to light.  Neck: Normal range of motion. Neck supple.  Cardiovascular: Normal rate, regular rhythm, normal heart sounds and intact distal pulses.  Pulmonary/Chest: Effort normal and breath sounds normal. Right breast exhibits no mass, no skin change and no tenderness. Left breast exhibits no mass, no skin change and no tenderness. Breasts are symmetrical.  Abdominal: Soft. Bowel sounds are normal.  Genitourinary: Vagina normal and uterus normal. Rectal exam shows no fissure. No breast  swelling, tenderness, discharge or bleeding. There is no tenderness or lesion on the right labia. There is no tenderness or lesion on the left labia. Uterus is not deviated, not enlarged and not tender. Cervix exhibits no motion tenderness, no discharge and no friability. Right adnexum displays no mass, no tenderness and no fullness. Left adnexum displays no mass, no tenderness and no fullness. No tenderness or bleeding in the vagina. No vaginal discharge found.  Neurological: She is alert and oriented to person, place, and time. She has normal reflexes.  Skin: Skin is warm and dry. No rash noted.  Psychiatric: She has a normal mood and affect. Her behavior is normal. Judgment and  thought content normal.  Nursing note and vitals reviewed.   Results for orders placed or performed in visit on 10/16/16  CBC with Differential/Platelet  Result Value Ref Range   WBC 11.4 (H) 3.4 - 10.8 x10E3/uL   RBC 4.88 3.77 - 5.28 x10E6/uL   Hemoglobin 11.0 (L) 11.1 - 15.9 g/dL   Hematocrit 35.2 34.0 - 46.6 %   MCV 72 (L) 79 - 97 fL   MCH 22.5 (L) 26.6 - 33.0 pg   MCHC 31.3 (L) 31.5 - 35.7 g/dL   RDW 15.3 12.3 - 15.4 %   Platelets 395 (H) 150 - 379 x10E3/uL   Neutrophils 61 Not Estab. %   Lymphs 25 Not Estab. %   Monocytes 11 Not Estab. %   Eos 3 Not Estab. %   Basos 0 Not Estab. %   Neutrophils Absolute 7.0 1.4 - 7.0 x10E3/uL   Lymphocytes Absolute 2.8 0.7 - 3.1 x10E3/uL   Monocytes Absolute 1.2 (H) 0.1 - 0.9 x10E3/uL   EOS (ABSOLUTE) 0.3 0.0 - 0.4 x10E3/uL   Basophils Absolute 0.0 0.0 - 0.2 x10E3/uL   Immature Granulocytes 0 Not Estab. %   Immature Grans (Abs) 0.0 0.0 - 0.1 x10E3/uL   Hematology Comments: Note:   CMP14+EGFR  Result Value Ref Range   Glucose 100 (H) 65 - 99 mg/dL   BUN 14 6 - 24 mg/dL   Creatinine, Ser 0.66 0.57 - 1.00 mg/dL   GFR calc non Af Amer 103 >59 mL/min/1.73   GFR calc Af Amer 119 >59 mL/min/1.73   BUN/Creatinine Ratio 21 9 - 23   Sodium 141 134 - 144 mmol/L   Potassium 4.2 3.5 - 5.2 mmol/L   Chloride 99 96 - 106 mmol/L   CO2 25 18 - 29 mmol/L   Calcium 9.1 8.7 - 10.2 mg/dL   Total Protein 7.8 6.0 - 8.5 g/dL   Albumin 3.9 3.5 - 5.5 g/dL   Globulin, Total 3.9 1.5 - 4.5 g/dL   Albumin/Globulin Ratio 1.0 (L) 1.2 - 2.2   Bilirubin Total 0.4 0.0 - 1.2 mg/dL   Alkaline Phosphatase 81 39 - 117 IU/L   AST 13 0 - 40 IU/L   ALT 13 0 - 32 IU/L  Lipid panel  Result Value Ref Range   Cholesterol, Total 190 100 - 199 mg/dL   Triglycerides 65 0 - 149 mg/dL   HDL 56 >39 mg/dL   VLDL Cholesterol Cal 13 5 - 40 mg/dL   LDL Calculated 121 (H) 0 - 99 mg/dL   Chol/HDL Ratio 3.4 0.0 - 4.4 ratio units      Assessment & Plan:   1. Well adult exam -  loratadine (CLARITIN) 10 MG tablet; Take 1 tablet (10 mg total) by mouth daily.  Dispense: 30 tablet; Refill: 11 - fluticasone (FLONASE) 50  MCG/ACT nasal spray; Place 2 sprays into both nostrils daily.  Dispense: 16 g; Refill: 11 - CBC with Differential/Platelet; Future - CMP14+EGFR; Future - Lipid panel; Future - TSH; Future - Pap IG w/ reflex to HPV when ASC-U  2. Need for immunization against influenza - Flu Vaccine QUAD 36+ mos IM    Current Outpatient Medications:  .  Cholecalciferol (VITAMIN D-3) 5000 UNITS TABS, Take by mouth., Disp: , Rfl:  .  ferrous fumarate (FERRO-SEQUELS) 50 MG CR tablet, Take 65 mg by mouth 2 (two) times daily between meals. , Disp: , Rfl:  .  fluticasone (FLONASE) 50 MCG/ACT nasal spray, Place 2 sprays into both nostrils daily., Disp: 16 g, Rfl: 11 .  gabapentin (NEURONTIN) 100 MG capsule, Take 1 capsule (100 mg total) by mouth 3 (three) times daily., Disp: 90 capsule, Rfl: 11 .  loratadine (CLARITIN) 10 MG tablet, Take 1 tablet (10 mg total) by mouth daily., Disp: 30 tablet, Rfl: 11 .  losartan-hydrochlorothiazide (HYZAAR) 50-12.5 MG tablet, Take 1 tablet by mouth daily., Disp: 30 tablet, Rfl: 11 .  Multiple Vitamins-Minerals (CENTURY-VITE PO), Take by mouth., Disp: , Rfl:  Continue all other maintenance medications as listed above.  Follow up plan: Return in about 1 year (around 10/18/2018) for well.  Educational handout given for [place] PA-C [facility] [address]  [place], [place] [address] [phone]   10/21/2017, 7:57 AM

## 2017-10-22 LAB — PAP IG W/ RFLX HPV ASCU: PAP Smear Comment: 0

## 2017-10-23 ENCOUNTER — Other Ambulatory Visit: Payer: BLUE CROSS/BLUE SHIELD

## 2017-10-23 DIAGNOSIS — Z0000 Encounter for general adult medical examination without abnormal findings: Secondary | ICD-10-CM | POA: Diagnosis not present

## 2017-10-24 LAB — CMP14+EGFR
ALBUMIN: 4.1 g/dL (ref 3.5–5.5)
ALK PHOS: 72 IU/L (ref 39–117)
ALT: 15 IU/L (ref 0–32)
AST: 16 IU/L (ref 0–40)
Albumin/Globulin Ratio: 1.1 — ABNORMAL LOW (ref 1.2–2.2)
BUN / CREAT RATIO: 19 (ref 9–23)
BUN: 13 mg/dL (ref 6–24)
Bilirubin Total: 0.6 mg/dL (ref 0.0–1.2)
CALCIUM: 9.6 mg/dL (ref 8.7–10.2)
CO2: 19 mmol/L — AB (ref 20–29)
CREATININE: 0.68 mg/dL (ref 0.57–1.00)
Chloride: 103 mmol/L (ref 96–106)
GFR calc Af Amer: 117 mL/min/{1.73_m2} (ref >59)
GFR, EST NON AFRICAN AMERICAN: 102 mL/min/{1.73_m2} (ref >59)
GLOBULIN, TOTAL: 3.6 g/dL (ref 1.5–4.5)
GLUCOSE: 94 mg/dL (ref 65–99)
Potassium: 4.6 mmol/L (ref 3.5–5.2)
SODIUM: 141 mmol/L (ref 134–144)
Total Protein: 7.7 g/dL (ref 6.0–8.5)

## 2017-10-24 LAB — LIPID PANEL
CHOL/HDL RATIO: 3.1 ratio (ref 0.0–4.4)
CHOLESTEROL TOTAL: 192 mg/dL (ref 100–199)
HDL: 62 mg/dL (ref >39)
LDL CALC: 116 mg/dL — AB (ref 0–99)
TRIGLYCERIDES: 68 mg/dL (ref 0–149)
VLDL CHOLESTEROL CAL: 14 mg/dL (ref 5–40)

## 2017-10-24 LAB — CBC WITH DIFFERENTIAL/PLATELET
BASOS: 1 %
Basophils Absolute: 0.1 10*3/uL (ref 0.0–0.2)
EOS (ABSOLUTE): 0.3 10*3/uL (ref 0.0–0.4)
EOS: 2 %
HEMATOCRIT: 36.8 % (ref 34.0–46.6)
Hemoglobin: 11.1 g/dL (ref 11.1–15.9)
Immature Grans (Abs): 0 10*3/uL (ref 0.0–0.1)
Immature Granulocytes: 0 %
Lymphocytes Absolute: 3.3 10*3/uL — ABNORMAL HIGH (ref 0.7–3.1)
Lymphs: 31 %
MCH: 22.1 pg — AB (ref 26.6–33.0)
MCHC: 30.2 g/dL — ABNORMAL LOW (ref 31.5–35.7)
MCV: 73 fL — AB (ref 79–97)
MONOS ABS: 1.1 10*3/uL — AB (ref 0.1–0.9)
Monocytes: 10 %
NEUTROS PCT: 56 %
Neutrophils Absolute: 6.1 10*3/uL (ref 1.4–7.0)
PLATELETS: 417 10*3/uL — AB (ref 150–379)
RBC: 5.02 x10E6/uL (ref 3.77–5.28)
RDW: 14.7 % (ref 12.3–15.4)
WBC: 10.7 10*3/uL (ref 3.4–10.8)

## 2017-10-24 LAB — TSH: TSH: 1.9 u[IU]/mL (ref 0.450–4.500)

## 2018-04-04 ENCOUNTER — Other Ambulatory Visit: Payer: Self-pay | Admitting: Physician Assistant

## 2018-04-04 MED ORDER — HYDROCHLOROTHIAZIDE 25 MG PO TABS: 25.0000 mg | ORAL_TABLET | Freq: Every day | ORAL | 3 refills | Status: DC

## 2018-04-04 MED ORDER — LOSARTAN POTASSIUM 50 MG PO TABS: 50.0000 mg | ORAL_TABLET | Freq: Every day | ORAL | 3 refills | Status: DC

## 2018-06-04 ENCOUNTER — Encounter: Payer: Self-pay | Admitting: Pediatrics

## 2018-06-04 ENCOUNTER — Ambulatory Visit: Payer: BLUE CROSS/BLUE SHIELD | Admitting: Pediatrics

## 2018-06-04 ENCOUNTER — Ambulatory Visit (INDEPENDENT_AMBULATORY_CARE_PROVIDER_SITE_OTHER): Payer: BLUE CROSS/BLUE SHIELD

## 2018-06-04 VITALS — BP 129/74 | HR 82 | Temp 98.2°F | Ht 62.0 in | Wt 238.6 lb

## 2018-06-04 DIAGNOSIS — D509 Iron deficiency anemia, unspecified: Secondary | ICD-10-CM

## 2018-06-04 DIAGNOSIS — G8929 Other chronic pain: Secondary | ICD-10-CM

## 2018-06-04 DIAGNOSIS — M25511 Pain in right shoulder: Secondary | ICD-10-CM | POA: Diagnosis not present

## 2018-06-04 DIAGNOSIS — Z1211 Encounter for screening for malignant neoplasm of colon: Secondary | ICD-10-CM

## 2018-06-04 DIAGNOSIS — B353 Tinea pedis: Secondary | ICD-10-CM | POA: Diagnosis not present

## 2018-06-04 MED ORDER — CLOTRIMAZOLE 1 % EX CREA: 1.0000 "application " | TOPICAL_CREAM | Freq: Two times a day (BID) | CUTANEOUS | 3 refills | Status: DC

## 2018-06-04 NOTE — Progress Notes (Signed)
  Subjective:   Patient ID: Rachel Pearson, female    DOB: 10/03/1966, 52 y.o.   MRN: [id number] CC: Shoulder Pain (Bilateral); Leg Pain (Bilateral); and Foot Pain (Bilateral)  HPI: Rachel Pearson is a 52 y.o. female   Shoulder pain: had surgery on L shoulder, now right shoulder bothering her more, feels similar to pain she was having in L shoulder. Works at [employer], has been having to work extra hours of overtime, has worsened pain in her shoulder. Asks for note for work.  Fatigued at times. Has been taking iron.   Relevant past medical, surgical, family and social history reviewed. Allergies and medications reviewed and updated. Social History   Tobacco Use  Smoking Status Never Smoker  Smokeless Tobacco Never Used   ROS: Per HPI   Objective:    BP 129/74   Pulse 82   Temp 98.2 F (36.8 C) (Oral)   Ht 5\' 2"  (1.575 m)   Wt 238 lb 9.6 oz (108.2 kg)   BMI 43.64 kg/m   Wt Readings from Last 3 Encounters:  06/04/18 238 lb 9.6 oz (108.2 kg)  10/18/17 241 lb (109.3 kg)  07/16/17 237 lb (107.5 kg)    Gen: NAD, alert, cooperative with exam, NCAT EYES: EOMI, no conjunctival injection, or no icterus CV: NRRR, normal S1/S2, no murmur, distal pulses 2+ b/l Resp: CTABL, no wheezes, normal WOB Ext: No edema, warm Neuro: Alert and oriented MSK: ttp posterior shoulder, pain with R shoulder ROM, able to fully abduct arm.  Skin: b/l feet with scaling skin consistent with tinea pedis  Assessment & Plan:  Rachel Pearson was seen today for shoulder pain, leg pain and foot pain.  Diagnoses and all orders for this visit:  Microcytic anemia We will check ferritin level to confirm iron deficiency. DDx includes thalassemia.  She should stop iron if not iron deficient, can cause iron overload. -     CBC with Differential -     Ferritin  Screening for colon cancer -     Ambulatory referral to Gastroenterology  Chronic right shoulder pain -     Ambulatory referral to Physical Therapy -      DG Shoulder Right; Future  Tinea pedis of both feet -     clotrimazole (LOTRIMIN) 1 % cream; Apply 1 application topically 2 (two) times daily.   Follow up plan: Return in about 4 weeks (around 07/02/2018). Rachel Krasarol Perri Aragones, MD [facility]

## 2018-06-05 LAB — CBC WITH DIFFERENTIAL/PLATELET
BASOS ABS: 0 10*3/uL (ref 0.0–0.2)
Basos: 0 %
EOS (ABSOLUTE): 0.2 10*3/uL (ref 0.0–0.4)
Eos: 1 %
Hematocrit: 34 % (ref 34.0–46.6)
Hemoglobin: 10.7 g/dL — ABNORMAL LOW (ref 11.1–15.9)
IMMATURE GRANULOCYTES: 0 %
Immature Grans (Abs): 0 10*3/uL (ref 0.0–0.1)
Lymphocytes Absolute: 3.2 10*3/uL — ABNORMAL HIGH (ref 0.7–3.1)
Lymphs: 23 %
MCH: 22.6 pg — ABNORMAL LOW (ref 26.6–33.0)
MCHC: 31.5 g/dL (ref 31.5–35.7)
MCV: 72 fL — ABNORMAL LOW (ref 79–97)
Monocytes Absolute: 1.2 10*3/uL — ABNORMAL HIGH (ref 0.1–0.9)
Monocytes: 9 %
NEUTROS PCT: 67 %
Neutrophils Absolute: 9.2 10*3/uL — ABNORMAL HIGH (ref 1.4–7.0)
PLATELETS: 374 10*3/uL (ref 150–450)
RBC: 4.73 x10E6/uL (ref 3.77–5.28)
RDW: 15 % (ref 12.3–15.4)
WBC: 13.9 10*3/uL — AB (ref 3.4–10.8)

## 2018-06-05 LAB — FERRITIN: Ferritin: 451 ng/mL — ABNORMAL HIGH (ref 15–150)

## 2018-06-07 ENCOUNTER — Encounter: Payer: Self-pay | Admitting: Pediatrics

## 2018-06-12 ENCOUNTER — Other Ambulatory Visit: Payer: Self-pay | Admitting: Pediatrics

## 2018-06-12 DIAGNOSIS — D509 Iron deficiency anemia, unspecified: Secondary | ICD-10-CM

## 2018-06-13 ENCOUNTER — Other Ambulatory Visit: Payer: BLUE CROSS/BLUE SHIELD

## 2018-06-13 ENCOUNTER — Telehealth: Payer: Self-pay | Admitting: Physician Assistant

## 2018-06-13 DIAGNOSIS — D509 Iron deficiency anemia, unspecified: Secondary | ICD-10-CM | POA: Diagnosis not present

## 2018-06-13 NOTE — Telephone Encounter (Signed)
Patient aware of results and verbalizes understanding.  

## 2018-06-17 ENCOUNTER — Encounter: Payer: Self-pay | Admitting: Internal Medicine

## 2018-06-17 LAB — CBC WITH DIFFERENTIAL/PLATELET
BASOS ABS: 0.1 10*3/uL (ref 0.0–0.2)
Basos: 0 %
EOS (ABSOLUTE): 0.3 10*3/uL (ref 0.0–0.4)
Eos: 2 %
Hematocrit: 35.1 % (ref 34.0–46.6)
Hemoglobin: 10.9 g/dL — ABNORMAL LOW (ref 11.1–15.9)
IMMATURE GRANULOCYTES: 0 %
Immature Grans (Abs): 0 10*3/uL (ref 0.0–0.1)
LYMPHS ABS: 3.5 10*3/uL — AB (ref 0.7–3.1)
Lymphs: 28 %
MCH: 22.4 pg — ABNORMAL LOW (ref 26.6–33.0)
MCHC: 31.1 g/dL — ABNORMAL LOW (ref 31.5–35.7)
MCV: 72 fL — ABNORMAL LOW (ref 79–97)
MONOCYTES: 9 %
MONOS ABS: 1.1 10*3/uL — AB (ref 0.1–0.9)
NEUTROS PCT: 61 %
Neutrophils Absolute: 7.4 10*3/uL — ABNORMAL HIGH (ref 1.4–7.0)
Platelets: 380 10*3/uL (ref 150–450)
RBC: 4.87 x10E6/uL (ref 3.77–5.28)
RDW: 14.2 % (ref 12.3–15.4)
WBC: 12.3 10*3/uL — AB (ref 3.4–10.8)

## 2018-06-17 LAB — HEMOGLOBINOPATHY EVALUATION
HEMOGLOBIN F QUANTITATION: 0 % (ref 0.0–2.0)
HGB A: 98 % (ref 96.4–98.8)
HGB C: 0 %
HGB S: 0 %
HGB VARIANT: 0 %
Hemoglobin A2 Quantitation: 2 % (ref 1.8–3.2)

## 2018-06-23 ENCOUNTER — Other Ambulatory Visit: Payer: Self-pay | Admitting: Physician Assistant

## 2018-06-23 DIAGNOSIS — Z1231 Encounter for screening mammogram for malignant neoplasm of breast: Secondary | ICD-10-CM

## 2018-07-16 ENCOUNTER — Ambulatory Visit (HOSPITAL_COMMUNITY): Payer: Self-pay

## 2018-07-21 ENCOUNTER — Ambulatory Visit (HOSPITAL_COMMUNITY)
Admission: RE | Admit: 2018-07-21 | Discharge: 2018-07-21 | Disposition: A | Discharge status: Home / Self Care | Payer: Managed Care, Other (non HMO) | Source: Ambulatory Visit | Attending: Physician Assistant | Admitting: Physician Assistant

## 2018-07-21 ENCOUNTER — Encounter (HOSPITAL_COMMUNITY): Payer: Self-pay

## 2018-07-21 DIAGNOSIS — Z1231 Encounter for screening mammogram for malignant neoplasm of breast: Secondary | ICD-10-CM | POA: Diagnosis not present

## 2018-08-13 ENCOUNTER — Emergency Department (HOSPITAL_COMMUNITY): Payer: Managed Care, Other (non HMO)

## 2018-08-13 ENCOUNTER — Other Ambulatory Visit: Payer: Self-pay

## 2018-08-13 ENCOUNTER — Encounter (HOSPITAL_COMMUNITY): Payer: Self-pay

## 2018-08-13 ENCOUNTER — Ambulatory Visit (INDEPENDENT_AMBULATORY_CARE_PROVIDER_SITE_OTHER): Payer: Managed Care, Other (non HMO) | Admitting: Family Medicine

## 2018-08-13 ENCOUNTER — Emergency Department (HOSPITAL_COMMUNITY)
Admission: EM | Admit: 2018-08-13 | Discharge: 2018-08-13 | Disposition: A | Discharge status: Home / Self Care | Payer: Managed Care, Other (non HMO) | Attending: Emergency Medicine | Admitting: Emergency Medicine

## 2018-08-13 VITALS — BP 142/85 | HR 98 | Temp 98.2°F

## 2018-08-13 DIAGNOSIS — I10 Essential (primary) hypertension: Secondary | ICD-10-CM | POA: Diagnosis not present

## 2018-08-13 DIAGNOSIS — R0789 Other chest pain: Secondary | ICD-10-CM

## 2018-08-13 DIAGNOSIS — Z79899 Other long term (current) drug therapy: Secondary | ICD-10-CM | POA: Diagnosis not present

## 2018-08-13 DIAGNOSIS — R079 Chest pain, unspecified: Secondary | ICD-10-CM | POA: Diagnosis present

## 2018-08-13 LAB — COMPREHENSIVE METABOLIC PANEL
ALT: 19 U/L (ref 0–44)
ANION GAP: 13 (ref 5–15)
AST: 22 U/L (ref 15–41)
Albumin: 3.9 g/dL (ref 3.5–5.0)
Alkaline Phosphatase: 67 U/L (ref 38–126)
BILIRUBIN TOTAL: 1 mg/dL (ref 0.3–1.2)
BUN: 18 mg/dL (ref 6–20)
CO2: 24 mmol/L (ref 22–32)
Calcium: 10.1 mg/dL (ref 8.9–10.3)
Chloride: 100 mmol/L (ref 98–111)
Creatinine, Ser: 0.72 mg/dL (ref 0.44–1.00)
Glucose, Bld: 119 mg/dL — ABNORMAL HIGH (ref 70–99)
POTASSIUM: 3.4 mmol/L — AB (ref 3.5–5.1)
Sodium: 137 mmol/L (ref 135–145)
TOTAL PROTEIN: 8.1 g/dL (ref 6.5–8.1)

## 2018-08-13 LAB — CBC WITH DIFFERENTIAL/PLATELET
Abs Immature Granulocytes: 0.05 10*3/uL (ref 0.00–0.07)
Basophils Absolute: 0 10*3/uL (ref 0.0–0.1)
Basophils Relative: 0 %
EOS ABS: 0.1 10*3/uL (ref 0.0–0.5)
Eosinophils Relative: 1 %
HEMATOCRIT: 37 % (ref 36.0–46.0)
Hemoglobin: 11.2 g/dL — ABNORMAL LOW (ref 12.0–15.0)
Immature Granulocytes: 0 %
LYMPHS PCT: 15 %
Lymphs Abs: 1.9 10*3/uL (ref 0.7–4.0)
MCH: 22.5 pg — AB (ref 26.0–34.0)
MCHC: 30.3 g/dL (ref 30.0–36.0)
MCV: 74.4 fL — AB (ref 80.0–100.0)
MONO ABS: 0.9 10*3/uL (ref 0.1–1.0)
MONOS PCT: 7 %
NEUTROS ABS: 9.8 10*3/uL — AB (ref 1.7–7.7)
Neutrophils Relative %: 77 %
Platelets: 392 10*3/uL (ref 150–400)
RBC: 4.97 MIL/uL (ref 3.87–5.11)
RDW: 14.1 % (ref 11.5–15.5)
WBC: 12.7 10*3/uL — ABNORMAL HIGH (ref 4.0–10.5)
nRBC: 0 % (ref 0.0–0.2)

## 2018-08-13 LAB — TROPONIN I: Troponin I: <0.03 ng/mL (ref <0.03)

## 2018-08-13 MED ORDER — ASPIRIN 325 MG PO TABS: 325.0000 mg | ORAL_TABLET | Freq: Once | ORAL | Status: DC

## 2018-08-13 MED ORDER — ONDANSETRON HCL 4 MG/2ML IJ SOLN
4.0000 mg | Freq: Once | INTRAMUSCULAR | Status: AC
  Administered 2018-08-13: 4 mg via INTRAVENOUS
  Filled 2018-08-13: qty 2

## 2018-08-13 MED ORDER — NAPROXEN 500 MG PO TABS: 500.0000 mg | ORAL_TABLET | Freq: Two times a day (BID) | ORAL | 0 refills | Status: DC

## 2018-08-13 MED ORDER — TRAMADOL HCL 50 MG PO TABS: 50.0000 mg | ORAL_TABLET | Freq: Four times a day (QID) | ORAL | 0 refills | Status: DC | PRN

## 2018-08-13 MED ORDER — NITROGLYCERIN 0.4 MG SL SUBL: 0.4000 mg | SUBLINGUAL_TABLET | Freq: Once | SUBLINGUAL | Status: DC

## 2018-08-13 MED ORDER — HYDROMORPHONE HCL 1 MG/ML IJ SOLN
0.5000 mg | Freq: Once | INTRAMUSCULAR | Status: AC
  Administered 2018-08-13: 0.5 mg via INTRAVENOUS
  Filled 2018-08-13: qty 1

## 2018-08-13 NOTE — Progress Notes (Signed)
BP (!) 159/87   Pulse 88   Temp 98.2 F (36.8 C) (Oral)    Subjective:    Patient ID: Rachel Pearson, female    DOB: 1966-01-03, 52 y.o.   MRN: [id number]  HPI: Rachel Pearson is a 52 y.o. female presenting on 08/13/2018 for Back Pain (Left back and shoulder pain)   HPI Left-sided shoulder and back discomfort with numbness and discomfort down her left arm difficulty breathing.  This started at 07 30 this morning and she has never had this before.  She did have a shoulder surgery recently and has had some pain from that but nothing like this and not with the breathing.  She says is been coming in waves and she took an Excedrin and it did not help at all.  She says she has been feeling some short of breath.  Patient describes the pain as 10 out of 10 and severe.  The pain goes into her upper shoulder blade on her back as well.  Relevant past medical, surgical, family and social history reviewed and updated as indicated. Interim medical history since our last visit reviewed. Allergies and medications reviewed and updated.  Review of Systems  Constitutional: Negative for chills and fever.  Eyes: Negative for visual disturbance.  Respiratory: Negative for cough, chest tightness and shortness of breath.   Cardiovascular: Positive for chest pain. Negative for palpitations and leg swelling.  Musculoskeletal: Negative for back pain and gait problem.  Skin: Negative for rash.  Neurological: Negative for light-headedness and headaches.  Psychiatric/Behavioral: Negative for agitation and behavioral problems.  All other systems reviewed and are negative.   Per HPI unless specifically indicated above   Allergies as of 08/13/2018   No Known Allergies     Medication List        Accurate as of 08/13/18  8:44 AM. Always use your most recent med list.          CENTURY-VITE PO Take by mouth.   clotrimazole 1 % cream Commonly known as:  LOTRIMIN Apply 1 application topically 2 (two)  times daily.   fluticasone 50 MCG/ACT nasal spray Commonly known as:  FLONASE Place 2 sprays into both nostrils daily.   gabapentin 100 MG capsule Commonly known as:  NEURONTIN Take 1 capsule (100 mg total) by mouth 3 (three) times daily.   hydrochlorothiazide 25 MG tablet Commonly known as:  HYDRODIURIL Take 1 tablet (25 mg total) by mouth daily.   loratadine 10 MG tablet Commonly known as:  CLARITIN Take 1 tablet (10 mg total) by mouth daily.   losartan 50 MG tablet Commonly known as:  COZAAR Take 1 tablet (50 mg total) by mouth daily.   Vitamin D-3 5000 units Tabs Take by mouth.          Objective:    BP (!) 159/87   Pulse 88   Temp 98.2 F (36.8 C) (Oral)   Wt Readings from Last 3 Encounters:  06/04/18 238 lb 9.6 oz (108.2 kg)  10/18/17 241 lb (109.3 kg)  07/16/17 237 lb (107.5 kg)    Physical Exam  Constitutional: She is oriented to person, place, and time. She appears well-developed and well-nourished. No distress.  Eyes: Conjunctivae are normal.  Neck: Neck supple. No thyromegaly present.  Cardiovascular: Normal rate, regular rhythm, normal heart sounds and intact distal pulses.  No murmur heard. Non-reproducible chest tenderness, no tenderness with range of motion or palpation of the shoulder.  Patient is somewhat diaphoretic at  times when pain increases.  Pulmonary/Chest: Effort normal and breath sounds normal. No respiratory distress. She has no wheezes.  Musculoskeletal: She exhibits no edema or tenderness.  Lymphadenopathy:    She has no cervical adenopathy.  Neurological: She is alert and oriented to person, place, and time. Coordination normal.  Skin: Skin is warm and dry. No rash noted. She is not diaphoretic.  Psychiatric: She has a normal mood and affect. Her behavior is normal.  Nursing note and vitals reviewed.   EKG: Sinus tachycardia at around 105 bpm, 1 PVC noted.  No signs of ST changes or Q wave abnormalities or T wave inversions  noted.    Assessment & Plan:   Problem List Items Addressed This Visit    None    Visit Diagnoses    Atypical chest pain    -  Primary   Relevant Medications   aspirin tablet 325 mg (Start on 08/13/2018  8:45 AM)   nitroGLYCERIN (NITROSTAT) SL tablet 0.4 mg (Start on 08/13/2018  8:45 AM)     Patient has run to 7.5% ASCVD risk score with atypical chest pressure and diaphoresis going down with the numbness in her left arm and pain in her left arm and shoulder.  History of hypertension with being 52 year old female.  Will send to emergency department for CAD rule out.  Patient will go via EMS  The 10-year ASCVD risk score Denman George DC Montez Hageman., et al., 2013) is: 7.5%   Values used to calculate the score:     Age: 13 years     Sex: Female     Is Non-Hispanic African American: Yes     Diabetic: No     Tobacco smoker: No     Systolic Blood Pressure: 159 mmHg     Is BP treated: Yes     HDL Cholesterol: 62 mg/dL     Total Cholesterol: 192 mg/dL  Follow up plan: Return if symptoms worsen or fail to improve.  Counseling provided for all of the vaccine components No orders of the defined types were placed in this encounter.   Arville Care, MD [facility] 08/13/2018, 8:44 AM

## 2018-08-13 NOTE — ED Provider Notes (Signed)
[facility] EMERGENCY DEPARTMENT Provider Note   CSN: [id number] Arrival date & time: 08/13/18  4098     History   Chief Complaint Chief Complaint  Patient presents with  . Chest Pain    HPI Rachel Pearson is a 52 y.o. female.  Patient complains of left chest pain and left upper back pain.  Pain is worse with movement.  She was seen by her primary provider today and they sent her to the emergency room to rule out cardiac reasons for pain  The history is provided by the patient. No language interpreter was used.  Chest Pain   This is a new problem. The current episode started 2 days ago. The problem occurs constantly. The problem has not changed since onset.The pain is associated with movement. The pain is present in the lateral region. The pain is at a severity of 6/10. The quality of the pain is described as dull. The pain does not radiate. The symptoms are aggravated by certain positions. Pertinent negatives include no abdominal pain, no back pain, no cough and no headaches.  Pertinent negatives for past medical history include no seizures.    Past Medical History:  Diagnosis Date  . Allergy   . Anemia   . Hypertension     Patient Active Problem List   Diagnosis Date Noted  . Hypertension 07/09/2013    Past Surgical History:  Procedure Laterality Date  . EYE SURGERY    . TUBAL LIGATION       OB History   None      Home Medications    Prior to Admission medications   Medication Sig Start Date End Date Taking? Authorizing Provider  Cholecalciferol (VITAMIN D-3) 5000 UNITS TABS Take by mouth.   Yes [provider]  clotrimazole (LOTRIMIN) 1 % cream Apply 1 application topically 2 (two) times daily. 06/04/18  Yes Johna Sheriff, MD  diclofenac sodium (VOLTAREN) 1 % GEL APPLY 2 TO 4 GMS TO AFFECTED AREAS TWICE DAILY 06/15/18  Yes [provider]  fluticasone (FLONASE) 50 MCG/ACT nasal spray Place 2 sprays into both nostrils daily. 10/18/17  Yes  Remus Loffler, PA-C  hydrochlorothiazide (HYDRODIURIL) 25 MG tablet Take 1 tablet (25 mg total) by mouth daily. 04/04/18  Yes Remus Loffler, PA-C  loratadine (CLARITIN) 10 MG tablet Take 1 tablet (10 mg total) by mouth daily. 10/18/17  Yes Remus Loffler, PA-C  losartan (COZAAR) 50 MG tablet Take 1 tablet (50 mg total) by mouth daily. 04/04/18  Yes Remus Loffler, PA-C  Multiple Vitamins-Minerals (CENTURY-VITE PO) Take by mouth.   Yes [provider]  gabapentin (NEURONTIN) 100 MG capsule Take 1 capsule (100 mg total) by mouth 3 (three) times daily. 10/11/16   Remus Loffler, PA-C  naproxen (NAPROSYN) 500 MG tablet Take 1 tablet (500 mg total) by mouth 2 (two) times daily. 08/13/18   Bethann Berkshire, MD  traMADol (ULTRAM) 50 MG tablet Take 1 tablet (50 mg total) by mouth every 6 (six) hours as needed. 08/13/18   Bethann Berkshire, MD    Family History Family History  Problem Relation Age of Onset  . Leukemia Mother   . Diabetes Mother   . Hypertension Mother   . Diabetes Father   . Hypertension Father   . Thyroid disease Sister   . Hypertension Brother   . Hypertension Brother     Social History Social History   Tobacco Use  . Smoking status: Never Smoker  .  Smokeless tobacco: Never Used  Substance Use Topics  . Alcohol use: No  . Drug use: No     Allergies   Patient has no known allergies.   Review of Systems Review of Systems  Constitutional: Negative for appetite change and fatigue.  HENT: Negative for congestion, ear discharge and sinus pressure.   Eyes: Negative for discharge.  Respiratory: Negative for cough.   Cardiovascular: Positive for chest pain.  Gastrointestinal: Negative for abdominal pain and diarrhea.  Genitourinary: Negative for frequency and hematuria.  Musculoskeletal: Negative for back pain.       Back pain  Skin: Negative for rash.  Neurological: Negative for seizures and headaches.  Psychiatric/Behavioral: Negative for hallucinations.      Physical Exam Updated Vital Signs BP 123/62   Pulse 74   Temp 98.3 F (36.8 C) (Oral)   Resp 17   Ht 5\' 2"  (1.575 m)   Wt 108.4 kg   LMP 08/06/2018   SpO2 94%   BMI 43.71 kg/m   Physical Exam  Constitutional: She is oriented to person, place, and time. She appears well-developed.  HENT:  Head: Normocephalic.  Eyes: Conjunctivae and EOM are normal. No scleral icterus.  Neck: Neck supple. No thyromegaly present.  Cardiovascular: Normal rate and regular rhythm. Exam reveals no gallop and no friction rub.  No murmur heard. Pulmonary/Chest: No stridor. She has no wheezes. She has no rales. She exhibits tenderness.  Abdominal: She exhibits no distension. There is no tenderness. There is no rebound.  Musculoskeletal: Normal range of motion. She exhibits no edema.  Left trapezius muscle tenderness  Lymphadenopathy:    She has no cervical adenopathy.  Neurological: She is oriented to person, place, and time. She exhibits normal muscle tone. Coordination normal.  Skin: No rash noted. No erythema.  Psychiatric: She has a normal mood and affect. Her behavior is normal.     ED Treatments / Results  Labs (all labs ordered are listed, but only abnormal results are displayed) Labs Reviewed  CBC WITH DIFFERENTIAL/PLATELET - Abnormal; Notable for the following components:      Result Value   WBC 12.7 (*)    Hemoglobin 11.2 (*)    MCV 74.4 (*)    MCH 22.5 (*)    Neutro Abs 9.8 (*)    All other components within normal limits  COMPREHENSIVE METABOLIC PANEL - Abnormal; Notable for the following components:   Potassium 3.4 (*)    Glucose, Bld 119 (*)    All other components within normal limits  TROPONIN I    EKG EKG Interpretation  Date/Time:  Wednesday August 13 2018 09:59:19 EDT Ventricular Rate:  70 PR Interval:    QRS Duration: 90 QT Interval:  412 QTC Calculation: 445 R Axis:   45 Text Interpretation:  Sinus rhythm Low voltage, precordial leads Confirmed by  Bethann Berkshire [phone]) on 08/13/2018 1:54:48 PM   Radiology Dg Chest 2 View  Result Date: 08/13/2018 CLINICAL DATA:  Left posterior chest pain EXAM: CHEST - 2 VIEW COMPARISON:  03/24/2009 FINDINGS: Studies AP lordotic in positioning. Mild cardiomegaly. No confluent opacities, effusions or edema. No acute bony abnormality. IMPRESSION: Mild cardiomegaly.  No active disease. Electronically Signed   By: Charlett Nose M.D.   On: 08/13/2018 11:22    Procedures Procedures (including critical care time)  Medications Ordered in ED Medications  HYDROmorphone (DILAUDID) injection 0.5 mg (0.5 mg Intravenous Given 08/13/18 1133)  ondansetron (ZOFRAN) injection 4 mg (4 mg Intravenous Given 08/13/18 1132)  Initial Impression / Assessment and Plan / ED Course  I have reviewed the triage vital signs and the nursing notes.  Pertinent labs & imaging results that were available during my care of the patient were reviewed by me and considered in my medical decision making (see chart for details). Labs and x-rays and EKG unremarkable    Chest pain and back pain related to muscle pain.  Patient will be placed on Naprosyn and Ultram and follow-up with her PCP  Final Clinical Impressions(s) / ED Diagnoses   Final diagnoses:  Atypical chest pain    ED Discharge Orders         Ordered    naproxen (NAPROSYN) 500 MG tablet  2 times daily     08/13/18 1414    traMADol (ULTRAM) 50 MG tablet  Every 6 hours PRN     08/13/18 1414           Bethann Berkshire, MD 08/13/18 1417

## 2018-08-13 NOTE — Discharge Instructions (Addendum)
Follow-up with your family doctor next week for recheck. 

## 2018-08-13 NOTE — ED Triage Notes (Signed)
Pt reports woke up at 0530 this morning and felt "fine."  Reports she did her usual morning routine and started having pain in back, left arm, and left shoulder.  Reports numbness in left hand started approx after the pain.  Pt has history of surgery to left shoulder for tear and bone spurs last year.  Pt went to pcp's office and they were concerned about chest pain and sent pt to er.  Pt denies cp.  Says the pain is in her shoulder.

## 2018-08-13 NOTE — ED Notes (Signed)
Pt returned from xray

## 2018-08-13 NOTE — ED Notes (Signed)
Pain much better, pt is still concerned with tingling in left fingers tips

## 2018-08-13 NOTE — ED Notes (Signed)
Patient given discharge instruction, verbalized understand. IV removed, band aid applied. Patient ambulatory out of the department.  

## 2018-08-15 ENCOUNTER — Encounter: Payer: Self-pay | Admitting: Pediatrics

## 2018-08-15 ENCOUNTER — Ambulatory Visit (INDEPENDENT_AMBULATORY_CARE_PROVIDER_SITE_OTHER): Payer: Managed Care, Other (non HMO)

## 2018-08-15 ENCOUNTER — Telehealth: Payer: Self-pay | Admitting: Physician Assistant

## 2018-08-15 ENCOUNTER — Ambulatory Visit: Payer: Managed Care, Other (non HMO) | Admitting: Pediatrics

## 2018-08-15 VITALS — BP 137/85 | HR 73 | Temp 97.3°F | Ht 62.0 in | Wt 237.0 lb

## 2018-08-15 DIAGNOSIS — Z23 Encounter for immunization: Secondary | ICD-10-CM

## 2018-08-15 DIAGNOSIS — M62838 Other muscle spasm: Secondary | ICD-10-CM | POA: Diagnosis not present

## 2018-08-15 DIAGNOSIS — M542 Cervicalgia: Secondary | ICD-10-CM

## 2018-08-15 MED ORDER — CYCLOBENZAPRINE HCL 10 MG PO TABS: 10.0000 mg | ORAL_TABLET | Freq: Three times a day (TID) | ORAL | 1 refills | Status: DC | PRN

## 2018-08-15 MED ORDER — NAPROXEN 500 MG PO TABS: 500.0000 mg | ORAL_TABLET | Freq: Two times a day (BID) | ORAL | 1 refills | Status: DC

## 2018-08-15 NOTE — Progress Notes (Signed)
Subjective:   Patient ID: Rachel Pearson, female    DOB: September 21, 1966, 52 y.o.   MRN: [id number] CC: Follow-up (ER visit)  HPI: Rachel Pearson is a 52 y.o. female   Felt a catch in her neck and shoulder when she moved certain way two days ago.  No injury that she knows of, just an awkward movement with her left arm.  Pain was severe, not improving with rest.  Was seen in the office, also having tingling in her left hand and shoulder, some atypical chest pain.  Was sent to the emergency room.  Cardiac work-up negative.  Determined to likely be musculoskeletal.  Treated with tramadol and naproxen.  She says the pain is slightly better today, still with the tingling in the left arm.  Sensation is normal in both hands.  No weakness in either hand.  She prefers to hold her arm bent because there is more pressure on her left shoulder when she lets her arm hanging.  No chest pain now, no shortness of breath.  She does use her arms regularly to work.  She had shoulder surgery left shoulder in February for arthritis she says and also had a muscle from her neck to her shoulder reattached.  Relevant past medical, surgical, family and social history reviewed. Allergies and medications reviewed and updated. Social History   Tobacco Use  Smoking Status Never Smoker  Smokeless Tobacco Never Used   ROS: Per HPI   Objective:    BP 137/85   Pulse 73   Temp (!) 97.3 F (36.3 C) (Oral)   Ht 5\' 2"  (1.575 m)   Wt 237 lb (107.5 kg)   LMP 08/06/2018   BMI 43.35 kg/m   Wt Readings from Last 3 Encounters:  08/15/18 237 lb (107.5 kg)  08/13/18 239 lb (108.4 kg)  06/04/18 238 lb 9.6 oz (108.2 kg)    Gen: NAD, alert, cooperative with exam, NCAT EYES: EOMI, no conjunctival injection, or no icterus ENT: OP without erythema LYMPH: no cervical LAD CV: NRRR, normal S1/S2, no murmur, distal pulses 2+ b/l Resp: CTABL, no wheezes, normal WOB Abd: +BS, soft, NTND. no guarding or organomegaly Ext: No edema,  warm Neuro: Alert and oriented, strength equal b/l UE and LE, coordination grossly normal MSK: Muscle spasm present left trapezius muscle, tender to palpation.  No point tenderness over spine.  Slightly decreased neck range of motion with turning head to the left and tilting chin down to the right.  Assessment & Plan:  Rachel Pearson was seen today for follow-up neck and shoulder pain.  Diagnoses and all orders for this visit:  Neck pain Continue anti-inflammatory with naproxen, refill given.  Discussed gentle neck stretches.  If not improving consider physical therapy.  Also gave muscle relaxer.  She has had in the past and tolerated.  Do not take and operate machinery including driving can cause sleepiness. -     DG Cervical Spine Complete; Future -     naproxen (NAPROSYN) 500 MG tablet; Take 1 tablet (500 mg total) by mouth 2 (two) times daily.  Muscle spasm -     cyclobenzaprine (FLEXERIL) 10 MG tablet; Take 1 tablet (10 mg total) by mouth 3 (three) times daily as needed for muscle spasms.  Note given for work, okay to stay out of work until next Wednesday.  May need extension a work note for an additional 1 to 2 weeks after that depending on symptoms.  Follow up plan: Return if symptoms worsen or  fail to improve. Rex Kras, MD [place] [facility]

## 2018-08-15 NOTE — Telephone Encounter (Signed)
Note up front 

## 2018-08-19 ENCOUNTER — Telehealth: Payer: Self-pay | Admitting: Physician Assistant

## 2018-08-19 NOTE — Telephone Encounter (Signed)
OK to write note, OK to go back to work, check with pt which date to specify, until 2 days after date should lift no more than 5 lbs and no lifting over head.

## 2018-08-19 NOTE — Telephone Encounter (Signed)
Patient would like to return to work 08/20/18- letter placed up front with restrictions. Patient aware.

## 2018-08-27 ENCOUNTER — Telehealth: Payer: Self-pay | Admitting: Physician Assistant

## 2018-08-29 NOTE — Telephone Encounter (Signed)
Yes, can fill out. What exactly are the dates that she needs? Is arm better now? Able to go back to work full time?

## 2018-09-02 NOTE — Telephone Encounter (Signed)
Pt is working full time now, work is wanting her to have FMLA papers done (intermittent) so it won't count against her if it happens again, she will bring in ppw this evening or tomorrow

## 2018-09-03 DIAGNOSIS — Z029 Encounter for administrative examinations, unspecified: Secondary | ICD-10-CM

## 2018-09-24 ENCOUNTER — Ambulatory Visit: Payer: Managed Care, Other (non HMO) | Admitting: Nurse Practitioner

## 2018-10-16 ENCOUNTER — Encounter: Payer: Managed Care, Other (non HMO) | Admitting: Physician Assistant

## 2018-10-16 ENCOUNTER — Ambulatory Visit: Payer: Managed Care, Other (non HMO) | Admitting: Physician Assistant

## 2018-11-05 ENCOUNTER — Encounter: Payer: Managed Care, Other (non HMO) | Admitting: Physician Assistant

## 2018-11-25 ENCOUNTER — Encounter: Payer: Self-pay | Admitting: Physician Assistant

## 2018-11-25 ENCOUNTER — Ambulatory Visit (INDEPENDENT_AMBULATORY_CARE_PROVIDER_SITE_OTHER): Payer: Managed Care, Other (non HMO) | Admitting: Physician Assistant

## 2018-11-25 VITALS — BP 115/73 | HR 73 | Temp 97.5°F | Ht 62.0 in | Wt 237.0 lb

## 2018-11-25 DIAGNOSIS — I10 Essential (primary) hypertension: Secondary | ICD-10-CM | POA: Diagnosis not present

## 2018-11-25 DIAGNOSIS — G5602 Carpal tunnel syndrome, left upper limb: Secondary | ICD-10-CM | POA: Insufficient documentation

## 2018-11-25 DIAGNOSIS — M5030 Other cervical disc degeneration, unspecified cervical region: Secondary | ICD-10-CM | POA: Diagnosis not present

## 2018-11-25 DIAGNOSIS — Z01411 Encounter for gynecological examination (general) (routine) with abnormal findings: Secondary | ICD-10-CM | POA: Diagnosis not present

## 2018-11-25 DIAGNOSIS — Z01419 Encounter for gynecological examination (general) (routine) without abnormal findings: Secondary | ICD-10-CM

## 2018-11-25 NOTE — Patient Instructions (Signed)
Carpal Tunnel Syndrome  Carpal tunnel syndrome is a condition that causes pain in your hand and arm. The carpal tunnel is a narrow area that is on the palm side of your wrist. Repeated wrist motion or certain diseases may cause swelling in the tunnel. This swelling can pinch the main nerve in the wrist (median nerve). What are the causes? This condition may be caused by:  Repeated wrist motions.  Wrist injuries.  Arthritis.  A sac of fluid (cyst) or abnormal growth (tumor) in the carpal tunnel.  Fluid buildup during pregnancy. Sometimes the cause is not known. What increases the risk? The following factors may make you more likely to develop this condition:  Having a job in which you move your wrist in the same way many times. This includes jobs like being a butcher or a cashier.  Being a woman.  Having other health conditions, such as: ? Diabetes. ? Obesity. ? A thyroid gland that is not active enough (hypothyroidism). ? Kidney failure. What are the signs or symptoms? Symptoms of this condition include:  A tingling feeling in your fingers.  Tingling or a loss of feeling (numbness) in your hand.  Pain in your entire arm. This pain may get worse when you bend your wrist and elbow for a long time.  Pain in your wrist that goes up your arm to your shoulder.  Pain that goes down into your palm or fingers.  A weak feeling in your hands. You may find it hard to grab and hold items. You may feel worse at night. How is this diagnosed? This condition is diagnosed with a medical history and physical exam. You may also have tests, such as:  Electromyogram (EMG). This test checks the signals that the nerves send to the muscles.  Nerve conduction study. This test checks how well signals pass through your nerves.  Imaging tests, such as X-rays, ultrasound, and MRI. These tests check for what might be the cause of your condition. How is this treated? This condition may be treated  with:  Lifestyle changes. You will be asked to stop or change the activity that caused your problem.  Doing exercise and activities that make bones and muscles stronger (physical therapy).  Learning how to use your hand again (occupational therapy).  Medicines for pain and swelling (inflammation). You may have injections in your wrist.  A wrist splint.  Surgery. Follow these instructions at home: If you have a splint:  Wear the splint as told by your doctor. Remove it only as told by your doctor.  Loosen the splint if your fingers: ? Tingle. ? Lose feeling (become numb). ? Turn cold and blue.  Keep the splint clean.  If the splint is not waterproof: ? Do not let it get wet. ? Cover it with a watertight covering when you take a bath or a shower. Managing pain, stiffness, and swelling   If told, put ice on the painful area: ? If you have a removable splint, remove it as told by your doctor. ? Put ice in a plastic bag. ? Place a towel between your skin and the bag. ? Leave the ice on for 20 minutes, 2-3 times per day. General instructions  Take over-the-counter and prescription medicines only as told by your doctor.  Rest your wrist from any activity that may cause pain. If needed, talk with your boss at work about changes that can help your wrist heal.  Do any exercises as told by your doctor,   physical therapist, or occupational therapist.  Keep all follow-up visits as told by your doctor. This is important. Contact a doctor if:  You have new symptoms.  Medicine does not help your pain.  Your symptoms get worse. Get help right away if:  You have very bad numbness or tingling in your wrist or hand. Summary  Carpal tunnel syndrome is a condition that causes pain in your hand and arm.  It is often caused by repeated wrist motions.  Lifestyle changes and medicines are used to treat this problem. Surgery may help in very bad cases.  Follow your doctor's  instructions about wearing a splint, resting your wrist, keeping follow-up visits, and calling for help. This information is not intended to replace advice given to you by your health care provider. Make sure you discuss any questions you have with your health care provider. Document Released: 09/20/2011 Document Revised: 02/07/2018 Document Reviewed: 02/07/2018 Elsevier Interactive Patient Education  2019 [employer] Neck Exercises Neck exercises can be important for many reasons:  They can help you to improve and maintain flexibility in your neck. This can be especially important as you age.  They can help to make your neck stronger. This can make movement easier.  They can reduce or prevent neck pain.  They may help your upper back. Ask your health care provider which neck exercises would be best for you. Exercises to improve neck flexibility Neck stretch Repeat this exercise 3-5 times. 1. Do this exercise while standing or while sitting in a chair. 2. Place your feet flat on the floor, shoulder-width apart. 3. Slowly turn your head to the right. Turn it all the way to the right so you can look over your right shoulder. Do not tilt or tip your head. 4. Hold this position for 10-30 seconds. 5. Slowly turn your head to the left, to look over your left shoulder. 6. Hold this position for 10-30 seconds.  Neck retraction Repeat this exercise 8-10 times. Do this 3-4 times a day or as told by your health care provider. 1. Do this exercise while standing or while sitting in a sturdy chair. 2. Look straight ahead. Do not bend your neck. 3. Use your fingers to push your chin backward. Do not bend your neck for this movement. Continue to face straight ahead. If you are doing the exercise properly, you will feel a slight sensation in your throat and a stretch at the back of your neck. 4. Hold the stretch for 1-2 seconds. Relax and repeat. Exercises to improve neck strength Neck  press Repeat this exercise 10 times. Do it first thing in the morning and right before bed or as told by your health care provider. 1. Lie on your back on a firm bed or on the floor with a pillow under your head. 2. Use your neck muscles to push your head down on the pillow and straighten your spine. 3. Hold the position as well as you can. Keep your head facing up and your chin tucked. 4. Slowly count to 5 while holding this position. 5. Relax for a few seconds. Then repeat. Isometric strengthening Do a full set of these exercises 2 times a day or as told by your health care provider. 1. Sit in a supportive chair and place your hand on your forehead. 2. Push forward with your head and neck while pushing back with your hand. Hold for 10 seconds. 3. Relax. Then repeat the exercise 3 times. 4. Next, do thesequence  again, this time putting your hand against the back of your head. Use your head and neck to push backward against the hand pressure. 5. Finally, do the same exercise on either side of your head, pushing sideways against the pressure of your hand. Prone head lifts Repeat this exercise 5 times. Do this 2 times a day or as told by your health care provider. 1. Lie face-down, resting on your elbows so that your chest and upper back are raised. 2. Start with your head facing downward, near your chest. Position your chin either on or near your chest. 3. Slowly lift your head upward. Lift until you are looking straight ahead. Then continue lifting your head as far back as you can stretch. 4. Hold your head up for 5 seconds. Then slowly lower it to your starting position. Supine head lifts Repeat this exercise 8-10 times. Do this 2 times a day or as told by your health care provider. 1. Lie on your back, bending your knees to point to the ceiling and keeping your feet flat on the floor. 2. Lift your head slowly off the floor, raising your chin toward your chest. 3. Hold for 5  seconds. 4. Relax and repeat. Scapular retraction Repeat this exercise 5 times. Do this 2 times a day or as told by your health care provider. 1. Stand with your arms at your sides. Look straight ahead. 2. Slowly pull both shoulders backward and downward until you feel a stretch between your shoulder blades in your upper back. 3. Hold for 10-30 seconds. 4. Relax and repeat. Contact a health care provider if:  Your neck pain or discomfort gets much worse when you do an exercise.  Your neck pain or discomfort does not improve within 2 hours after you exercise. If you have any of these problems, stop exercising right away. Do not do the exercises again unless your health care provider says that you can. Get help right away if:  You develop sudden, severe neck pain. If this happens, stop exercising right away. Do not do the exercises again unless your health care provider says that you can. This information is not intended to replace advice given to you by your health care provider. Make sure you discuss any questions you have with your health care provider. Document Released: 09/12/2015 Document Revised: 02/04/2018 Document Reviewed: 04/11/2015 Elsevier Interactive Patient Education  2019 [employer].

## 2018-11-25 NOTE — Progress Notes (Signed)
BP 115/73   Pulse 73   Temp (!) 97.5 F (36.4 C) (Oral)   Ht '5\' 2"'  (1.575 m)   Wt 237 lb (107.5 kg)   BMI 43.35 kg/m    Subjective:    Patient ID: Rachel Pearson, female    DOB: [birth year], 53 y.o.   MRN: [id number]  HPI: Rachel Pearson is a 53 y.o. female presenting on 11/25/2018 for Annual Exam (cpe)  This patient comes in for annual well physical examination. All medications are reviewed today. There are no reports of any problems with the medications.   Lab work is reviewed and will be ordered as medically necessary.   Patient reports that she continues with left hand pain and numbness.  She was diagnosed with degenerative disc disease of the cervical spine.  She was experience a lot of left arm and shoulder radicular pain.  This has come down most of the time.  It does flareup several days per month and she will have days out.  In addition the hand will go numb particularly through the middle 3 fingers.  I do think she probably even has some carpal tunnel syndrome going on.  Past Medical History:  Diagnosis Date  . Allergy   . Anemia   . Hypertension    Relevant past medical, surgical, family and social history reviewed and updated as indicated. Interim medical history since our last visit reviewed. Allergies and medications reviewed and updated. DATA REVIEWED: CHART IN EPIC  Family History reviewed for pertinent findings.  Review of Systems  Constitutional: Negative.  Negative for activity change, fatigue and fever.  HENT: Negative.   Eyes: Negative.   Respiratory: Negative.  Negative for cough.   Cardiovascular: Negative.  Negative for chest pain.  Gastrointestinal: Negative.  Negative for abdominal pain.  Endocrine: Negative.   Genitourinary: Negative.  Negative for dysuria.  Musculoskeletal: Positive for arthralgias, back pain, joint swelling and myalgias.  Skin: Negative.   Neurological: Positive for numbness.    Allergies as of 11/25/2018   No Known  Allergies     Medication List       Accurate as of November 25, 2018  2:17 PM. Always use your most recent med list.        CENTURY-VITE PO Take by mouth.   clotrimazole 1 % cream Commonly known as:  LOTRIMIN Apply 1 application topically 2 (two) times daily.   cyclobenzaprine 10 MG tablet Commonly known as:  FLEXERIL Take 1 tablet (10 mg total) by mouth 3 (three) times daily as needed for muscle spasms.   diclofenac sodium 1 % Gel Commonly known as:  VOLTAREN APPLY 2 TO 4 GMS TO AFFECTED AREAS TWICE DAILY   fluticasone 50 MCG/ACT nasal spray Commonly known as:  FLONASE Place 2 sprays into both nostrils daily.   gabapentin 100 MG capsule Commonly known as:  NEURONTIN Take 1 capsule (100 mg total) by mouth 3 (three) times daily.   hydrochlorothiazide 25 MG tablet Commonly known as:  HYDRODIURIL Take 1 tablet (25 mg total) by mouth daily.   loratadine 10 MG tablet Commonly known as:  CLARITIN Take 1 tablet (10 mg total) by mouth daily.   losartan 50 MG tablet Commonly known as:  COZAAR Take 1 tablet (50 mg total) by mouth daily.   traMADol 50 MG tablet Commonly known as:  ULTRAM Take 1 tablet (50 mg total) by mouth every 6 (six) hours as needed.   Vitamin D-3 125 MCG (5000 UT) Tabs Take  by mouth.          Objective:    BP 115/73   Pulse 73   Temp (!) 97.5 F (36.4 C) (Oral)   Ht '5\' 2"'  (1.575 m)   Wt 237 lb (107.5 kg)   BMI 43.35 kg/m   No Known Allergies  Wt Readings from Last 3 Encounters:  11/25/18 237 lb (107.5 kg)  08/15/18 237 lb (107.5 kg)  08/13/18 239 lb (108.4 kg)    Physical Exam Constitutional:      Appearance: She is well-developed.  HENT:     Head: Normocephalic and atraumatic.  Eyes:     Conjunctiva/sclera: Conjunctivae normal.     Pupils: Pupils are equal, round, and reactive to light.  Neck:     Musculoskeletal: Normal range of motion and neck supple.  Cardiovascular:     Rate and Rhythm: Normal rate and regular rhythm.      Heart sounds: Normal heart sounds.  Pulmonary:     Effort: Pulmonary effort is normal.     Breath sounds: Normal breath sounds.  Chest:     Breasts: Breasts are symmetrical.        Right: No mass, skin change or tenderness.        Left: No mass, skin change or tenderness.  Abdominal:     General: Bowel sounds are normal.     Palpations: Abdomen is soft.  Genitourinary:    Labia:        Right: No tenderness or lesion.        Left: No tenderness or lesion.      Vagina: Normal. No vaginal discharge, tenderness or bleeding.     Cervix: No cervical motion tenderness, discharge or friability.     Uterus: Not deviated, not enlarged and not tender.      Adnexa:        Right: No mass, tenderness or fullness.         Left: No mass, tenderness or fullness.       Rectum: No anal fissure.  Musculoskeletal:     Cervical back: She exhibits decreased range of motion, pain and spasm.       Back:     Left hand: She exhibits normal range of motion, no tenderness and no deformity. Normal strength noted.       Hands:  Skin:    General: Skin is warm and dry.     Findings: No rash.  Neurological:     Mental Status: She is alert and oriented to person, place, and time.     Deep Tendon Reflexes: Reflexes are normal and symmetric.  Psychiatric:        Behavior: Behavior normal.        Thought Content: Thought content normal.        Judgment: Judgment normal.     Results for orders placed or performed during the hospital encounter of 08/13/18  Troponin I  Result Value Ref Range   Troponin I <0.03 <0.03 ng/mL  CBC with Differential/Platelet  Result Value Ref Range   WBC 12.7 (H) 4.0 - 10.5 K/uL   RBC 4.97 3.87 - 5.11 MIL/uL   Hemoglobin 11.2 (L) 12.0 - 15.0 g/dL   HCT 37.0 36.0 - 46.0 %   MCV 74.4 (L) 80.0 - 100.0 fL   MCH 22.5 (L) 26.0 - 34.0 pg   MCHC 30.3 30.0 - 36.0 g/dL   RDW 14.1 11.5 - 15.5 %   Platelets 392 150 - 400 K/uL  nRBC 0.0 0.0 - 0.2 %   Neutrophils Relative % 77 %    Neutro Abs 9.8 (H) 1.7 - 7.7 K/uL   Lymphocytes Relative 15 %   Lymphs Abs 1.9 0.7 - 4.0 K/uL   Monocytes Relative 7 %   Monocytes Absolute 0.9 0.1 - 1.0 K/uL   Eosinophils Relative 1 %   Eosinophils Absolute 0.1 0.0 - 0.5 K/uL   Basophils Relative 0 %   Basophils Absolute 0.0 0.0 - 0.1 K/uL   Immature Granulocytes 0 %   Abs Immature Granulocytes 0.05 0.00 - 0.07 K/uL  Comprehensive metabolic panel  Result Value Ref Range   Sodium 137 135 - 145 mmol/L   Potassium 3.4 (L) 3.5 - 5.1 mmol/L   Chloride 100 98 - 111 mmol/L   CO2 24 22 - 32 mmol/L   Glucose, Bld 119 (H) 70 - 99 mg/dL   BUN 18 6 - 20 mg/dL   Creatinine, Ser 0.72 0.44 - 1.00 mg/dL   Calcium 10.1 8.9 - 10.3 mg/dL   Total Protein 8.1 6.5 - 8.1 g/dL   Albumin 3.9 3.5 - 5.0 g/dL   AST 22 15 - 41 U/L   ALT 19 0 - 44 U/L   Alkaline Phosphatase 67 38 - 126 U/L   Total Bilirubin 1.0 0.3 - 1.2 mg/dL   GFR calc non Af Amer >60 >60 mL/min   GFR calc Af Amer >60 >60 mL/min   Anion gap 13 5 - 15      Assessment & Plan:   1. Well female exam with routine gynecological exam - CBC with Differential/Platelet - CMP14+EGFR - Lipid panel - TSH - IGP, Aptima HPV, rfx 16/18,45  2. Essential hypertension Continue medications  3. Carpal tunnel syndrome of left wrist Splint as mch as possible  4. DDD (degenerative disc disease), cervical Continue medications FMLA completed for the year, may miss up to 5 days per month   Continue all other maintenance medications as listed above.  Follow up plan: Return in about 3 months (around 02/23/2019) for recheck.  Educational handout given for DDD, carpal tunnel  Terald Sleeper PA-C [facility] [address]  [place], [place] [address] [phone]   11/25/2018, 2:17 PM

## 2018-11-26 LAB — LIPID PANEL
CHOL/HDL RATIO: 3.2 ratio (ref 0.0–4.4)
Cholesterol, Total: 183 mg/dL (ref 100–199)
HDL: 57 mg/dL (ref >39)
LDL CALC: 112 mg/dL — AB (ref 0–99)
TRIGLYCERIDES: 68 mg/dL (ref 0–149)
VLDL CHOLESTEROL CAL: 14 mg/dL (ref 5–40)

## 2018-11-26 LAB — CBC WITH DIFFERENTIAL/PLATELET
BASOS: 1 %
Basophils Absolute: 0.1 10*3/uL (ref 0.0–0.2)
EOS (ABSOLUTE): 0.2 10*3/uL (ref 0.0–0.4)
Eos: 2 %
Hematocrit: 36.6 % (ref 34.0–46.6)
Hemoglobin: 11.5 g/dL (ref 11.1–15.9)
IMMATURE GRANS (ABS): 0 10*3/uL (ref 0.0–0.1)
IMMATURE GRANULOCYTES: 0 %
Lymphocytes Absolute: 3.8 10*3/uL — ABNORMAL HIGH (ref 0.7–3.1)
Lymphs: 30 %
MCH: 22.6 pg — ABNORMAL LOW (ref 26.6–33.0)
MCHC: 31.4 g/dL — ABNORMAL LOW (ref 31.5–35.7)
MCV: 72 fL — AB (ref 79–97)
Monocytes Absolute: 1.1 10*3/uL — ABNORMAL HIGH (ref 0.1–0.9)
Monocytes: 9 %
Neutrophils Absolute: 7.2 10*3/uL — ABNORMAL HIGH (ref 1.4–7.0)
Neutrophils: 58 %
PLATELETS: 378 10*3/uL (ref 150–450)
RBC: 5.09 x10E6/uL (ref 3.77–5.28)
RDW: 13.8 % (ref 11.7–15.4)
WBC: 12.4 10*3/uL — AB (ref 3.4–10.8)

## 2018-11-26 LAB — CMP14+EGFR
A/G RATIO: 1.4 (ref 1.2–2.2)
ALT: 15 IU/L (ref 0–32)
AST: 14 IU/L (ref 0–40)
Albumin: 4.4 g/dL (ref 3.8–4.9)
Alkaline Phosphatase: 78 IU/L (ref 39–117)
BUN/Creatinine Ratio: 20 (ref 9–23)
BUN: 15 mg/dL (ref 6–24)
Bilirubin Total: 0.5 mg/dL (ref 0.0–1.2)
CO2: 24 mmol/L (ref 20–29)
Calcium: 10 mg/dL (ref 8.7–10.2)
Chloride: 97 mmol/L (ref 96–106)
Creatinine, Ser: 0.75 mg/dL (ref 0.57–1.00)
GFR, EST AFRICAN AMERICAN: 106 mL/min/{1.73_m2} (ref >59)
GFR, EST NON AFRICAN AMERICAN: 92 mL/min/{1.73_m2} (ref >59)
GLUCOSE: 90 mg/dL (ref 65–99)
Globulin, Total: 3.1 g/dL (ref 1.5–4.5)
Potassium: 4 mmol/L (ref 3.5–5.2)
Sodium: 138 mmol/L (ref 134–144)
TOTAL PROTEIN: 7.5 g/dL (ref 6.0–8.5)

## 2018-11-26 LAB — TSH: TSH: 1.18 u[IU]/mL (ref 0.450–4.500)

## 2018-11-28 LAB — IGP, APTIMA HPV, RFX 16/18,45: HPV Aptima: NEGATIVE

## 2018-12-12 ENCOUNTER — Other Ambulatory Visit: Payer: Self-pay | Admitting: *Deleted

## 2018-12-12 ENCOUNTER — Encounter: Payer: Self-pay | Admitting: Nurse Practitioner

## 2018-12-12 ENCOUNTER — Ambulatory Visit (INDEPENDENT_AMBULATORY_CARE_PROVIDER_SITE_OTHER): Payer: Managed Care, Other (non HMO) | Admitting: Nurse Practitioner

## 2018-12-12 ENCOUNTER — Encounter: Payer: Self-pay | Admitting: *Deleted

## 2018-12-12 DIAGNOSIS — Z0000 Encounter for general adult medical examination without abnormal findings: Secondary | ICD-10-CM | POA: Diagnosis not present

## 2018-12-12 DIAGNOSIS — D649 Anemia, unspecified: Secondary | ICD-10-CM | POA: Insufficient documentation

## 2018-12-12 MED ORDER — CLENPIQ 10-3.5-12 MG-GM -GM/160ML PO SOLN: 1.0000 | Freq: Once | ORAL | 0 refills | Status: AC

## 2018-12-12 NOTE — Assessment & Plan Note (Signed)
The patient is 53 years old and due for first-ever screening colonoscopy.  She does have anemia, as per above, but this is more likely thalassemia than blood loss.  She is generally asymptomatic from a GI standpoint today.  We will proceed with colonoscopy for routine screening purposes.  Follow-up based on post procedure recommendations or as needed for GI symptoms  Proceed with TCS on propofol/MAC with Dr. Gala Romney in near future: the risks, benefits, and alternatives have been discussed with the patient in detail. The patient states understanding and desires to proceed.  The patient's BMI is borderline near 45.  She is currently on Ultram and Neurontin.  No other anticoagulants, anxiolytics, chronic pain medications, or antidepressants.  We will plan for the procedure on propofol/MAC to promote adequate sedation.

## 2018-12-12 NOTE — Assessment & Plan Note (Signed)
The patient has mild anemia which is microcytic and hypochromic but elevated iron.  Unlikely due to GI blood losses.  Primary care work-up mentions more likely thalassemia.  It appears there was an intent to refer her to hematology but this is not happened.  We will proceed with colonoscopy, as per below, for routine screening purposes and will not for diagnostic purposes related to her anemia given her primary care visits and their impression along with lab work-up data.

## 2018-12-12 NOTE — Patient Instructions (Signed)
Your health issues we discussed today were:   Need for colonoscopy: 1. You are due for your first ever screening colonoscopy due to your age 53. Further recommendations will be made after your colonoscopy  Anemia: 1. You do have mild anemia, although it does not appear to be because of chronic blood losses 2. Follow-up with primary care for further work-up and discussion related to your anemia  Overall I recommend:  1. Follow-up based on recommendations made after your procedure, or as needed for GI symptoms 2. Call us if you have any questions or concerns.  At [facility] we value your feedback. You may receive a survey about your visit today. Please share your experience as we strive to create trusting relationships with our patients to provide genuine, compassionate, quality care.  We appreciate your understanding and patience as we review any laboratory studies, imaging, and other diagnostic tests that are ordered as we care for you. Our office policy is 5 business days for review of these results, and any emergent or urgent results are addressed in a timely manner for your best interest. If you do not hear from our office in 1 week, please contact us.   We also encourage the use of MyChart, which contains your medical information for your review as well. If you are not enrolled in this feature, an access code is on this after visit summary for your convenience. Thank you for allowing [place] to be involved in your care.  It was great to see you today!  I hope you have a great day!!

## 2018-12-12 NOTE — Progress Notes (Signed)
Primary Care Physician:  Remus Loffler, PA-C Primary Gastroenterologist:  Dr. Jena Gauss  Chief Complaint  Patient presents with  . Anemia  . Colonoscopy    never had tcs    HPI:   Rachel Pearson is a 53 y.o. female who presents on referral from primary care for anemia and need for colonoscopy.  Reviewed information provided with referral including associated office visit dated 06/04/2018.  At that time the patient noted intermittent fatigue, taking iron.  Noted microcytic anemia and put an order for CBC and ferritin to delineate between iron deficiency anemia and other differentials including thalassemia.  Is also due for colon cancer screening at that time.  Labs were completed the same day and found hemoglobin of 10.7 which was 11.1-year prior.  Microcytic and hypochromic indices.  Ferritin was elevated at 451.  Recommended stopping oral iron and referral to hematology.  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  Follow-up rechecks of hemoglobin showed improvement to 10.9, 11.2, and most recently 2 weeks ago 11.5.  Today she states she's doing well. Has not been referred to hematology yet. Has had some discussions with PCP about anemia. Has never had a colonoscopy before. Denies ongoing abdominal pain, N/V, hematochezia, melena, fever, chills, unintentional weight loss. Appetite is good, energy is waxing and waning which she attributes to her work at a [employer] with 12 hour shifts and moderate physical component. Denies chest pain, dyspnea, dizziness, lightheadedness, syncope, near syncope. Denies any other upper or lower GI symptoms.  Past Medical History:  Diagnosis Date  . Allergy   . Anemia   . Hypertension     Past Surgical History:  Procedure Laterality Date  . EYE SURGERY Bilateral    x 2 (bilateral)  . TUBAL LIGATION      Current Outpatient Medications  Medication Sig Dispense Refill  . Cholecalciferol (VITAMIN D-3)  5000 UNITS TABS Take by mouth daily.     . clotrimazole (LOTRIMIN) 1 % cream Apply 1 application topically 2 (two) times daily. 30 g 3  . cyclobenzaprine (FLEXERIL) 10 MG tablet Take 1 tablet (10 mg total) by mouth 3 (three) times daily as needed for muscle spasms. 60 tablet 1  . diclofenac sodium (VOLTAREN) 1 % GEL APPLY 2 TO 4 GMS TO AFFECTED AREAS TWICE DAILY  0  . fluticasone (FLONASE) 50 MCG/ACT nasal spray Place 2 sprays into both nostrils daily. 16 g 11  . gabapentin (NEURONTIN) 100 MG capsule Take 1 capsule (100 mg total) by mouth 3 (three) times daily. 90 capsule 11  . hydrochlorothiazide (HYDRODIURIL) 25 MG tablet Take 1 tablet (25 mg total) by mouth daily. 90 tablet 3  . loratadine (CLARITIN) 10 MG tablet Take 1 tablet (10 mg total) by mouth daily. 30 tablet 11  . losartan (COZAAR) 50 MG tablet Take 1 tablet (50 mg total) by mouth daily. 90 tablet 3  . Multiple Vitamins-Minerals (CENTURY-VITE PO) Take by mouth.    . traMADol (ULTRAM) 50 MG tablet Take 1 tablet (50 mg total) by mouth every 6 (six) hours as needed. 20 tablet 0   Current Facility-Administered Medications  Medication Dose Route Frequency Provider Last Rate Last Dose  . aspirin tablet 325 mg  325 mg Oral Once Dettinger, Elige Radon, MD      . nitroGLYCERIN (NITROSTAT) SL tablet 0.4 mg  0.4 mg Sublingual Once Dettinger, Elige Radon, MD        Allergies as of 12/12/2018  . (No Known  Allergies)    Family History  Problem Relation Age of Onset  . Leukemia Mother   . Diabetes Mother   . Hypertension Mother   . Diabetes Father   . Hypertension Father   . Thyroid disease Sister   . Hypertension Brother   . Hypertension Brother   . Colon cancer Neg Hx     Social History   Socioeconomic History  . Marital status: Married    Spouse name: Not on file  . Number of children: Not on file  . Years of education: Not on file  . Highest education level: Not on file  Occupational History  . Not on file  Social Needs  .  Financial resource strain: Not on file  . Food insecurity:    Worry: Not on file    Inability: Not on file  . Transportation needs:    Medical: Not on file    Non-medical: Not on file  Tobacco Use  . Smoking status: Never Smoker  . Smokeless tobacco: Never Used  Substance and Sexual Activity  . Alcohol use: No  . Drug use: No  . Sexual activity: Yes  Lifestyle  . Physical activity:    Days per week: Not on file    Minutes per session: Not on file  . Stress: Not on file  Relationships  . Social connections:    Talks on phone: Not on file    Gets together: Not on file    Attends religious service: Not on file    Active member of club or organization: Not on file    Attends meetings of clubs or organizations: Not on file    Relationship status: Not on file  . Intimate partner violence:    Fear of current or ex partner: Not on file    Emotionally abused: Not on file    Physically abused: Not on file    Forced sexual activity: Not on file  Other Topics Concern  . Not on file  Social History Narrative  . Not on file    Review of Systems: General: Negative for anorexia, weight loss, fever, chills, fatigue, weakness. ENT: Negative for hoarseness, difficulty swallowing. CV: Negative for chest pain, angina, palpitations, peripheral edema.  Respiratory: Negative for dyspnea at rest, cough, sputum, wheezing.  GI: See history of present illness. MS: Negative for joint pain, low back pain.  Derm: Negative for rash or itching.  Endo: Negative for unusual weight change.  Heme: Negative for bruising or bleeding. Allergy: Negative for rash or hives.    Physical Exam: BP 131/77   Pulse 74   Temp 97.6 F (36.4 C) (Oral)   Ht 5\' 2"  (1.575 m)   Wt 242 lb (109.8 kg)   LMP 10/20/2018 (Approximate)   BMI 44.26 kg/m  General:   Alert and oriented. Pleasant and cooperative. Well-nourished and well-developed.  Head:  Normocephalic and atraumatic. Eyes:  Without icterus, sclera  clear and conjunctiva pink.  Ears:  Normal auditory acuity. Cardiovascular:  S1, S2 present without murmurs appreciated. Extremities without clubbing or edema. Respiratory:  Clear to auscultation bilaterally. No wheezes, rales, or rhonchi. No distress.  Gastrointestinal:  +BS, soft, non-tender and non-distended. No HSM noted. No guarding or rebound. No masses appreciated.  Rectal:  Deferred  Musculoskalatal:  Symmetrical without gross deformities. Neurologic:  Alert and oriented x4;  grossly normal neurologically. Psych:  Alert and cooperative. Normal mood and affect. Heme/Lymph/Immune: No excessive bruising noted.    12/12/2018 10:55 AM   Disclaimer:  This note was dictated with voice recognition software. Similar sounding words can inadvertently be transcribed and may not be corrected upon review.

## 2018-12-15 ENCOUNTER — Telehealth: Payer: Self-pay | Admitting: *Deleted

## 2018-12-15 NOTE — Progress Notes (Signed)
CC'D TO PCP °

## 2018-12-15 NOTE — Telephone Encounter (Signed)
Pre-op scheduled for 4/24 at 10:00am. LMOVM. Letter mailed.

## 2018-12-29 ENCOUNTER — Other Ambulatory Visit: Payer: Self-pay | Admitting: Physician Assistant

## 2018-12-29 DIAGNOSIS — Z0000 Encounter for general adult medical examination without abnormal findings: Secondary | ICD-10-CM

## 2018-12-30 ENCOUNTER — Other Ambulatory Visit: Payer: Self-pay | Admitting: *Deleted

## 2018-12-30 DIAGNOSIS — Z0000 Encounter for general adult medical examination without abnormal findings: Secondary | ICD-10-CM

## 2018-12-30 MED ORDER — LORATADINE 10 MG PO TABS: 10.0000 mg | ORAL_TABLET | Freq: Every day | ORAL | 1 refills | Status: DC

## 2019-01-13 IMAGING — MG DIGITAL SCREENING BILATERAL MAMMOGRAM WITH TOMO AND CAD
6 of 10 series · 6 of 30 positions shown · non-contrast
Comparison: Previous exam(s).

CLINICAL DATA: Screening.

EXAM:
DIGITAL SCREENING BILATERAL MAMMOGRAM WITH TOMO AND CAD

[L CC synth-2D]
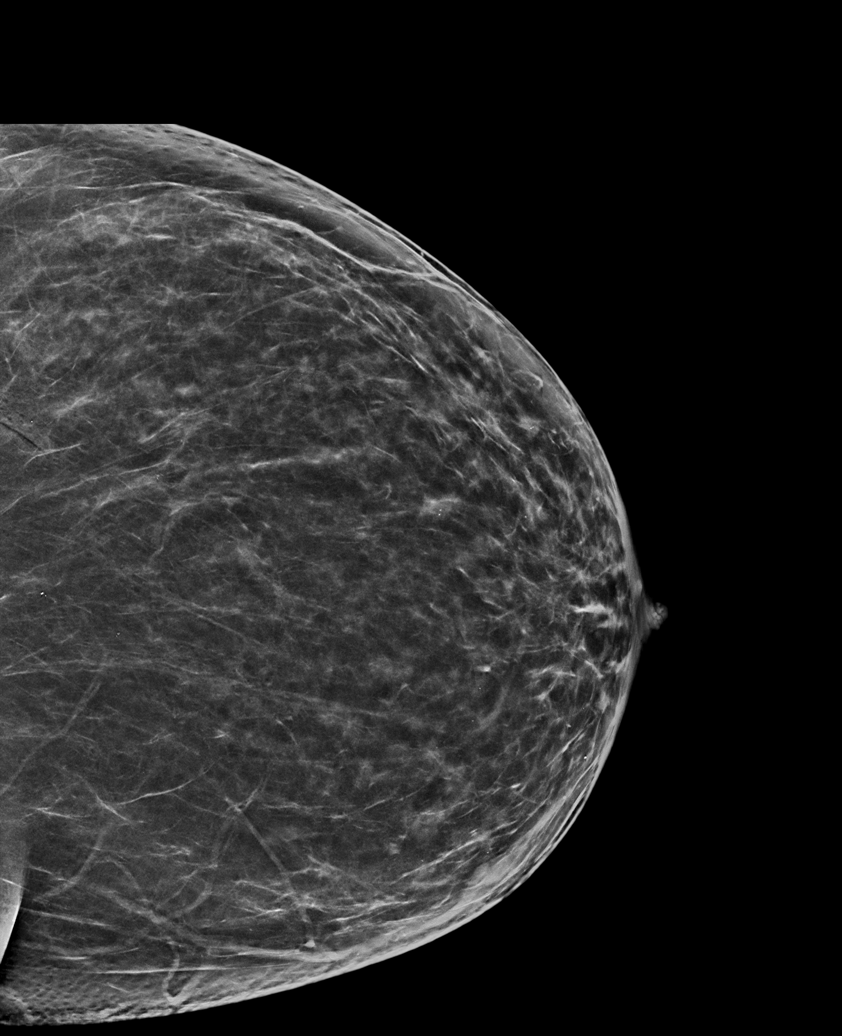

[R MLO synth-2D]
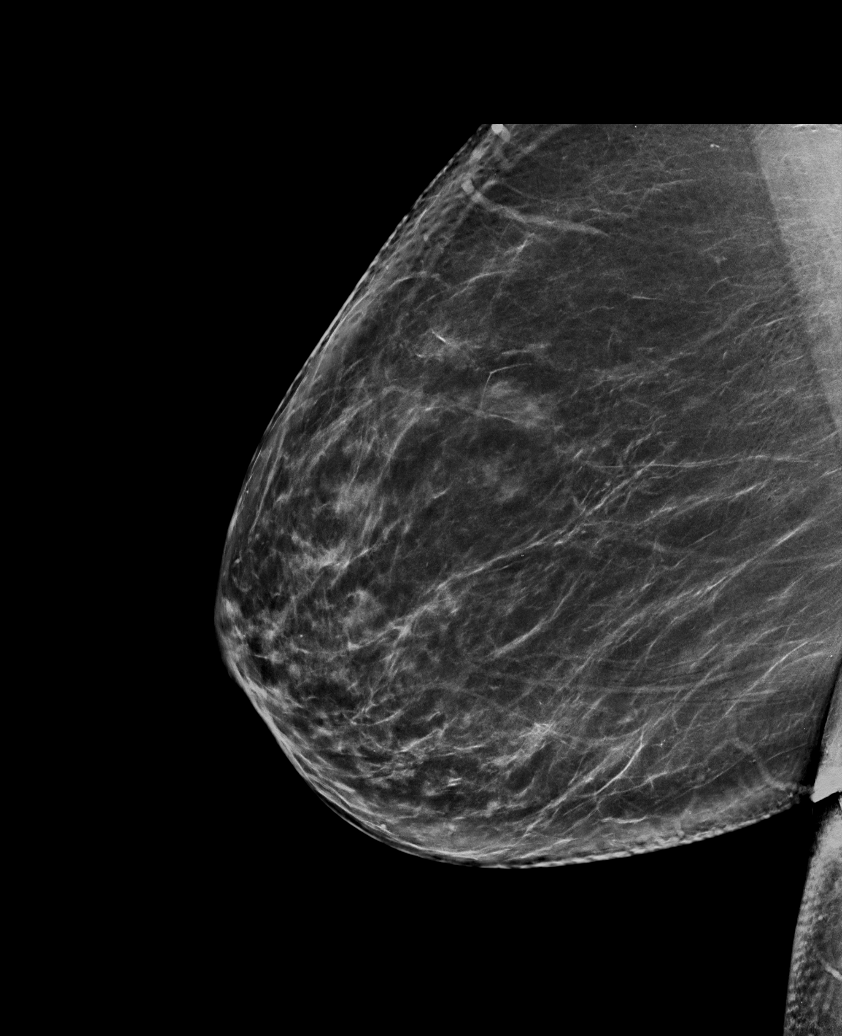

[L MLO synth-2D (1 of 2)]
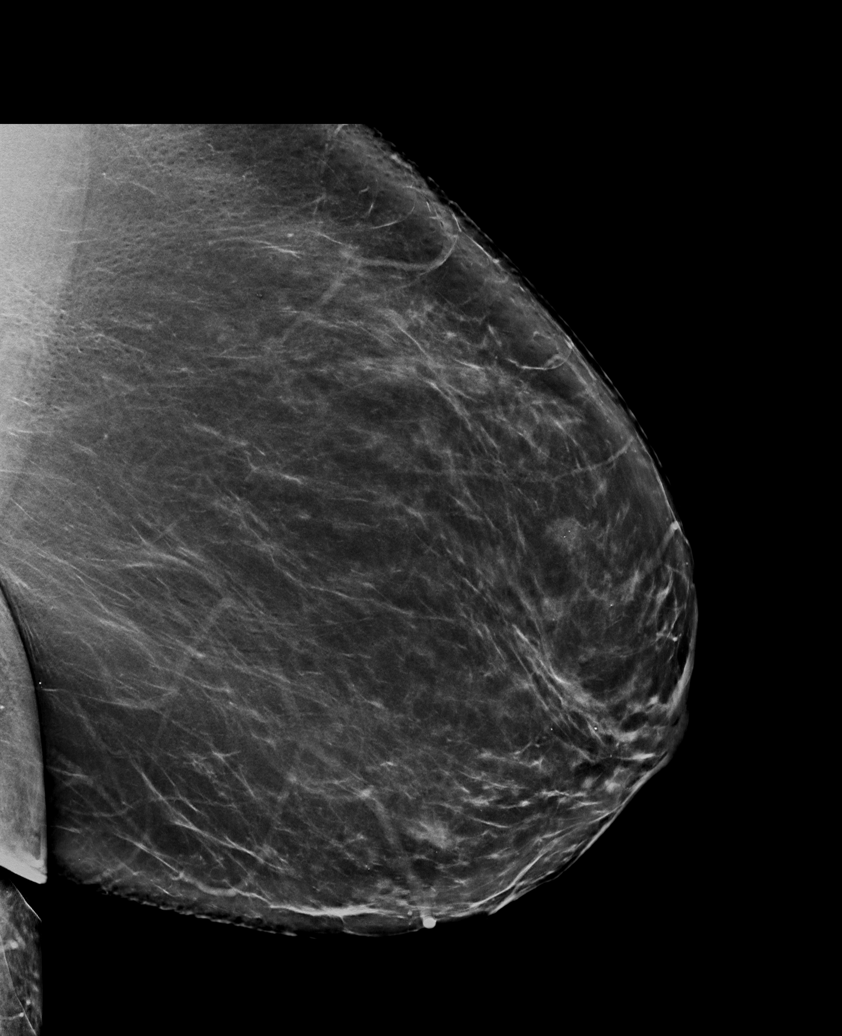

[R CC synth-2D]
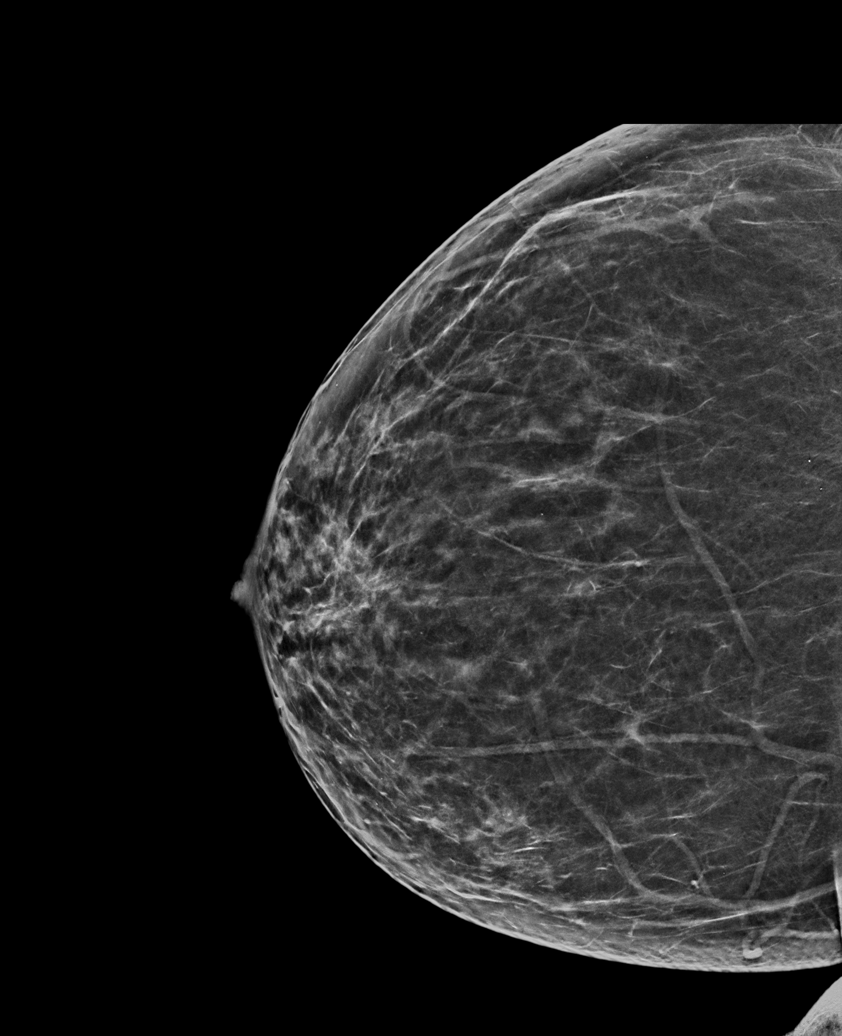

[L MLO synth-2D (2 of 2)]
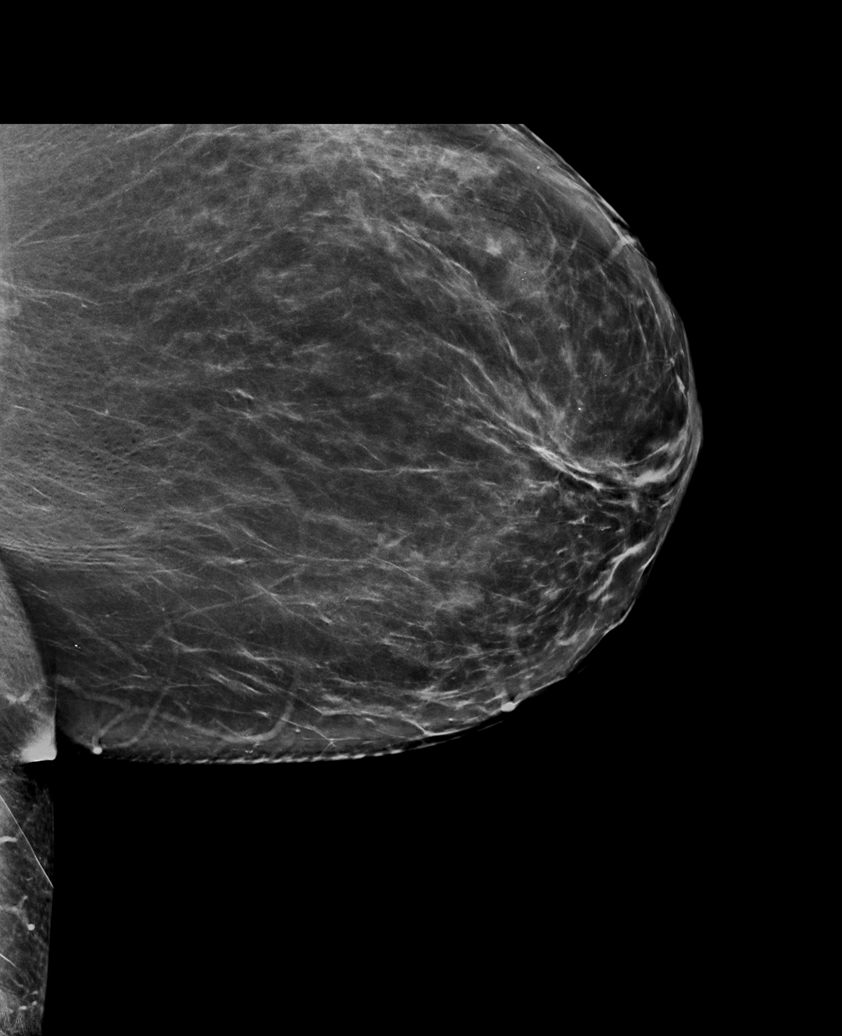

[R MLO tomo · tomo slice 47/93.0]
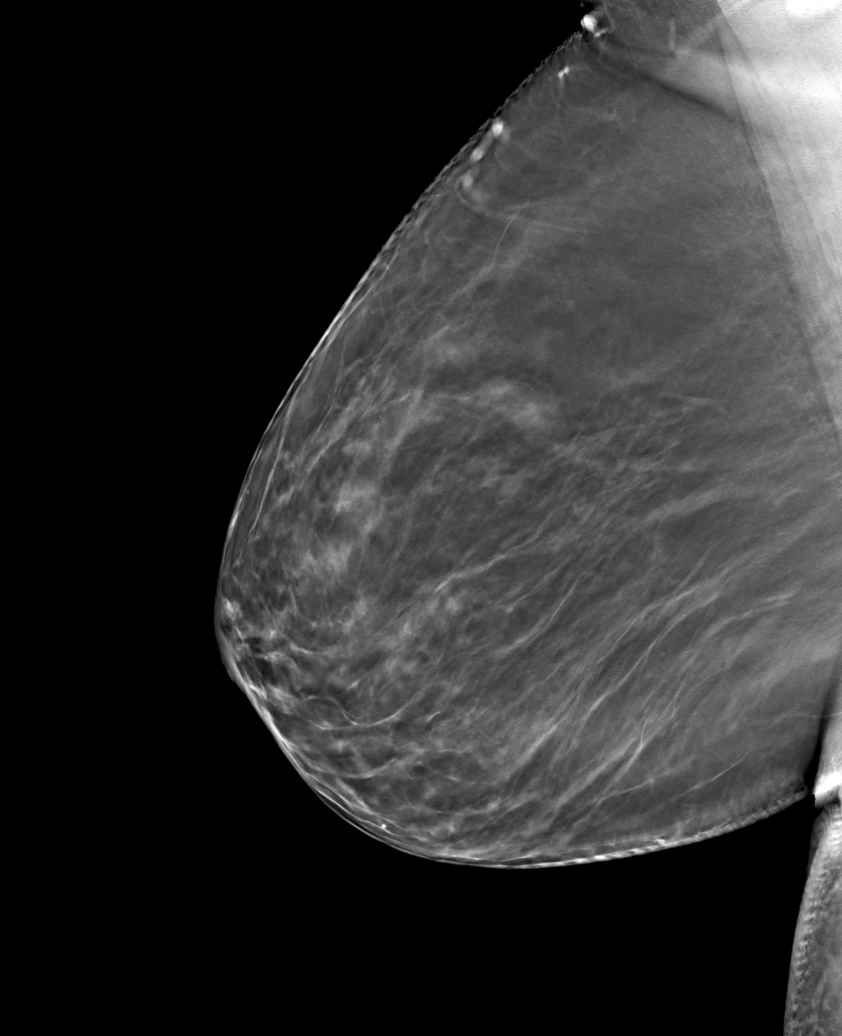

[6 of 30 positions shown; findings below may reference images not displayed]

ACR Breast Density Category b: There are scattered areas of
fibroglandular density.
FINDINGS: There are no findings suspicious for malignancy. Images were
processed with CAD.
IMPRESSION: No mammographic evidence of malignancy. A result letter of this
screening mammogram will be mailed directly to the patient.

RECOMMENDATION:
Screening mammogram in one year. (Code:CN-[id number])

BI-RADS CATEGORY  1: Negative.

## 2019-01-27 ENCOUNTER — Telehealth: Payer: Self-pay

## 2019-01-27 NOTE — Telephone Encounter (Signed)
TCS w/Propofol w/RMR that was for 02/12/19 will need to be rescheduled d/t COVID-19 restrictions. Tried to call pt, no answer, LMOVM for return call. 

## 2019-01-28 NOTE — Telephone Encounter (Signed)
Pt called office, TCS rescheduled to 04/09/19 at 10:00am. LMOVM for endo scheduler.

## 2019-01-28 NOTE — Telephone Encounter (Signed)
Pre-op appt rescheduled to 04/03/19 at 10:00am. Letter mailed with new procedure instructions.

## 2019-02-06 ENCOUNTER — Other Ambulatory Visit (HOSPITAL_COMMUNITY): Payer: Self-pay

## 2019-02-25 ENCOUNTER — Telehealth: Payer: Self-pay | Admitting: Physician Assistant

## 2019-02-25 ENCOUNTER — Encounter: Payer: Self-pay | Admitting: *Deleted

## 2019-02-25 NOTE — Telephone Encounter (Signed)
Patient aware that letter is ready to be picked up  

## 2019-02-25 NOTE — Telephone Encounter (Signed)
Please review and advise on letter.

## 2019-02-25 NOTE — Telephone Encounter (Signed)
That is okay, he has very uncontrolled blood pressure and we have not gotten it down.  You can just put in the letter, medical condition though

## 2019-03-30 NOTE — Patient Instructions (Signed)
Rachel Pearson  03/30/2019     @PREFPERIOPPHARMACY @   Your procedure is scheduled on  04/09/2019.  Report to [facility] at  830  A.M.  Call this number if you have problems the morning of surgery:  [phone]   Remember:  Follow the diet and prep instructions given to you by Dr Roseanne Kaufman office.                     Take these medicines the morning of surgery with A SIP OF WATER  Flexaril, gabapentin, tramadol.    Do not wear jewelry, make-up or nail polish.  Do not wear lotions, powders, or perfumes, or deodorant.  Do not shave 48 hours prior to surgery.  Men may shave face and neck.  Do not bring valuables to the hospital.  [facility] is not responsible for any belongings or valuables.  Contacts, dentures or bridgework may not be worn into surgery.  Leave your suitcase in the car.  After surgery it may be brought to your room.  For patients admitted to the hospital, discharge time will be determined by your treatment team.  Patients discharged the day of surgery will not be allowed to drive home.   Name and phone number of your driver:   family Special instructions:  None  Please read over the following fact sheets that you were given. Anesthesia Post-op Instructions and Care and Recovery After Surgery       Colonoscopy, Adult, Care After This sheet gives you information about how to care for yourself after your procedure. Your health care provider may also give you more specific instructions. If you have problems or questions, contact your health care provider. What can I expect after the procedure? After the procedure, it is common to have:  A small amount of blood in your stool for 24 hours after the procedure.  Some gas.  Mild abdominal cramping or bloating. Follow these instructions at home: General instructions  For the first 24 hours after the procedure: ? Do not drive or use machinery. ? Do not sign important documents. ? Do not drink alcohol.  ? Do your regular daily activities at a slower pace than normal. ? Eat soft, easy-to-digest foods.  Take over-the-counter or prescription medicines only as told by your health care provider. Relieving cramping and bloating   Try walking around when you have cramps or feel bloated.  Apply heat to your abdomen as told by your health care provider. Use a heat source that your health care provider recommends, such as a moist heat pack or a heating pad. ? Place a towel between your skin and the heat source. ? Leave the heat on for 20-30 minutes. ? Remove the heat if your skin turns bright red. This is especially important if you are unable to feel pain, heat, or cold. You may have a greater risk of getting burned. Eating and drinking   Drink enough fluid to keep your urine pale yellow.  Resume your normal diet as instructed by your health care provider. Avoid heavy or fried foods that are hard to digest.  Avoid drinking alcohol for as long as instructed by your health care provider. Contact a health care provider if:  You have blood in your stool 2-3 days after the procedure. Get help right away if:  You have more than a small spotting of blood in your stool.  You pass large blood clots in your stool.  Your abdomen is swollen.  You have nausea or vomiting.  You have a fever.  You have increasing abdominal pain that is not relieved with medicine. Summary  After the procedure, it is common to have a small amount of blood in your stool. You may also have mild abdominal cramping and bloating.  For the first 24 hours after the procedure, do not drive or use machinery, sign important documents, or drink alcohol.  Contact your health care provider if you have a lot of blood in your stool, nausea or vomiting, a fever, or increased abdominal pain. This information is not intended to replace advice given to you by your health care provider. Make sure you discuss any questions you have  with your health care provider. Document Released: 05/15/2004 Document Revised: 07/24/2017 Document Reviewed: 12/13/2015 Elsevier Interactive Patient Education  2019 [employer] Monitored Anesthesia Care, Care After These instructions provide you with information about caring for yourself after your procedure. Your health care provider may also give you more specific instructions. Your treatment has been planned according to current medical practices, but problems sometimes occur. Call your health care provider if you have any problems or questions after your procedure. What can I expect after the procedure? After your procedure, you may:  Feel sleepy for several hours.  Feel clumsy and have poor balance for several hours.  Feel forgetful about what happened after the procedure.  Have poor judgment for several hours.  Feel nauseous or vomit.  Have a sore throat if you had a breathing tube during the procedure. Follow these instructions at home: For at least 24 hours after the procedure:      Have a responsible adult stay with you. It is important to have someone help care for you until you are awake and alert.  Rest as needed.  Do not: ? Participate in activities in which you could fall or become injured. ? Drive. ? Use heavy machinery. ? Drink alcohol. ? Take sleeping pills or medicines that cause drowsiness. ? Make important decisions or sign legal documents. ? Take care of children on your own. Eating and drinking  Follow the diet that is recommended by your health care provider.  If you vomit, drink water, juice, or soup when you can drink without vomiting.  Make sure you have little or no nausea before eating solid foods. General instructions  Take over-the-counter and prescription medicines only as told by your health care provider.  If you have sleep apnea, surgery and certain medicines can increase your risk for breathing problems. Follow instructions from  your health care provider about wearing your sleep device: ? Anytime you are sleeping, including during daytime naps. ? While taking prescription pain medicines, sleeping medicines, or medicines that make you drowsy.  If you smoke, do not smoke without supervision.  Keep all follow-up visits as told by your health care provider. This is important. Contact a health care provider if:  You keep feeling nauseous or you keep vomiting.  You feel light-headed.  You develop a rash.  You have a fever. Get help right away if:  You have trouble breathing. Summary  For several hours after your procedure, you may feel sleepy and have poor judgment.  Have a responsible adult stay with you for at least 24 hours or until you are awake and alert. This information is not intended to replace advice given to you by your health care provider. Make sure you discuss any questions you have with your health  care provider. Document Released: 01/22/2016 Document Revised: 05/17/2017 Document Reviewed: 01/22/2016 Elsevier Interactive Patient Education  2019 [employer].

## 2019-04-03 ENCOUNTER — Encounter (HOSPITAL_COMMUNITY)
Admission: RE | Admit: 2019-04-03 | Discharge: 2019-04-03 | Disposition: A | Discharge status: Home / Self Care | Payer: Managed Care, Other (non HMO) | Source: Ambulatory Visit | Attending: Internal Medicine | Admitting: Internal Medicine

## 2019-04-03 ENCOUNTER — Encounter (HOSPITAL_COMMUNITY): Payer: Self-pay

## 2019-04-03 ENCOUNTER — Other Ambulatory Visit: Payer: Self-pay

## 2019-04-03 DIAGNOSIS — Z1159 Encounter for screening for other viral diseases: Secondary | ICD-10-CM | POA: Diagnosis not present

## 2019-04-03 DIAGNOSIS — Z01812 Encounter for preprocedural laboratory examination: Secondary | ICD-10-CM | POA: Insufficient documentation

## 2019-04-03 LAB — CBC WITH DIFFERENTIAL/PLATELET
Abs Immature Granulocytes: 0.05 10*3/uL (ref 0.00–0.07)
Basophils Absolute: 0 10*3/uL (ref 0.0–0.1)
Basophils Relative: 0 %
Eosinophils Absolute: 0.2 10*3/uL (ref 0.0–0.5)
Eosinophils Relative: 2 %
HCT: 37.2 % (ref 36.0–46.0)
Hemoglobin: 10.9 g/dL — ABNORMAL LOW (ref 12.0–15.0)
Immature Granulocytes: 1 %
Lymphocytes Relative: 24 %
Lymphs Abs: 2.4 10*3/uL (ref 0.7–4.0)
MCH: 22.2 pg — ABNORMAL LOW (ref 26.0–34.0)
MCHC: 29.3 g/dL — ABNORMAL LOW (ref 30.0–36.0)
MCV: 75.6 fL — ABNORMAL LOW (ref 80.0–100.0)
Monocytes Absolute: 0.9 10*3/uL (ref 0.1–1.0)
Monocytes Relative: 8 %
Neutro Abs: 6.7 10*3/uL (ref 1.7–7.7)
Neutrophils Relative %: 65 %
Platelets: 371 10*3/uL (ref 150–400)
RBC: 4.92 MIL/uL (ref 3.87–5.11)
RDW: 14.2 % (ref 11.5–15.5)
WBC: 10.2 10*3/uL (ref 4.0–10.5)
nRBC: 0 % (ref 0.0–0.2)

## 2019-04-03 LAB — BASIC METABOLIC PANEL
Anion gap: 13 (ref 5–15)
BUN: 12 mg/dL (ref 6–20)
CO2: 24 mmol/L (ref 22–32)
Calcium: 8.9 mg/dL (ref 8.9–10.3)
Chloride: 101 mmol/L (ref 98–111)
Creatinine, Ser: 0.72 mg/dL (ref 0.44–1.00)
GFR calc Af Amer: >60 mL/min (ref >60)
GFR calc non Af Amer: >60 mL/min (ref >60)
Glucose, Bld: 186 mg/dL — ABNORMAL HIGH (ref 70–99)
Potassium: 3.3 mmol/L — ABNORMAL LOW (ref 3.5–5.1)
Sodium: 138 mmol/L (ref 135–145)

## 2019-04-03 LAB — HCG, SERUM, QUALITATIVE: Preg, Serum: NEGATIVE

## 2019-04-06 ENCOUNTER — Telehealth: Payer: Self-pay

## 2019-04-06 ENCOUNTER — Other Ambulatory Visit: Payer: Self-pay

## 2019-04-06 ENCOUNTER — Other Ambulatory Visit (HOSPITAL_COMMUNITY)
Admission: RE | Admit: 2019-04-06 | Discharge: 2019-04-06 | Disposition: A | Discharge status: Home / Self Care | Payer: Managed Care, Other (non HMO) | Source: Ambulatory Visit | Attending: Internal Medicine | Admitting: Internal Medicine

## 2019-04-06 DIAGNOSIS — Z01812 Encounter for preprocedural laboratory examination: Secondary | ICD-10-CM | POA: Diagnosis not present

## 2019-04-06 NOTE — Telephone Encounter (Signed)
Patient needs a letter for her employer, Rachel Pearson, stating that had a test this morning for corona virus and also needs a note stating she has a colonoscopy scheduled and Thursday and she will not be at work until Friday or Monday.  Please advise and route to Pool A.

## 2019-04-06 NOTE — Telephone Encounter (Signed)
It is okay for you to do the this note as requested

## 2019-04-06 NOTE — Telephone Encounter (Signed)
Letter wrote and placed up front for patient.

## 2019-04-07 LAB — NOVEL CORONAVIRUS, NAA (HOSP ORDER, SEND-OUT TO REF LAB; TAT 18-24 HRS): SARS-CoV-2, NAA: NOT DETECTED

## 2019-04-09 ENCOUNTER — Encounter (HOSPITAL_COMMUNITY): Payer: Self-pay

## 2019-04-09 ENCOUNTER — Ambulatory Visit (HOSPITAL_COMMUNITY)
Admission: RE | Admit: 2019-04-09 | Discharge: 2019-04-09 | Disposition: A | Discharge status: Home / Self Care | Payer: Managed Care, Other (non HMO) | Attending: Internal Medicine | Admitting: Internal Medicine

## 2019-04-09 ENCOUNTER — Ambulatory Visit (HOSPITAL_COMMUNITY): Payer: Managed Care, Other (non HMO) | Admitting: Anesthesiology

## 2019-04-09 ENCOUNTER — Encounter (HOSPITAL_COMMUNITY)
Admission: RE | Disposition: A | Discharge status: Home / Self Care | Payer: Self-pay | Source: Home / Self Care | Attending: Internal Medicine

## 2019-04-09 ENCOUNTER — Other Ambulatory Visit: Payer: Self-pay

## 2019-04-09 DIAGNOSIS — Z8249 Family history of ischemic heart disease and other diseases of the circulatory system: Secondary | ICD-10-CM | POA: Diagnosis not present

## 2019-04-09 DIAGNOSIS — Z1211 Encounter for screening for malignant neoplasm of colon: Secondary | ICD-10-CM | POA: Insufficient documentation

## 2019-04-09 DIAGNOSIS — D649 Anemia, unspecified: Secondary | ICD-10-CM | POA: Diagnosis not present

## 2019-04-09 DIAGNOSIS — Z833 Family history of diabetes mellitus: Secondary | ICD-10-CM | POA: Insufficient documentation

## 2019-04-09 DIAGNOSIS — I10 Essential (primary) hypertension: Secondary | ICD-10-CM | POA: Diagnosis not present

## 2019-04-09 DIAGNOSIS — Z806 Family history of leukemia: Secondary | ICD-10-CM | POA: Diagnosis not present

## 2019-04-09 DIAGNOSIS — Z79899 Other long term (current) drug therapy: Secondary | ICD-10-CM | POA: Insufficient documentation

## 2019-04-09 DIAGNOSIS — K621 Rectal polyp: Secondary | ICD-10-CM | POA: Diagnosis not present

## 2019-04-09 DIAGNOSIS — K5730 Diverticulosis of large intestine without perforation or abscess without bleeding: Secondary | ICD-10-CM | POA: Diagnosis not present

## 2019-04-09 DIAGNOSIS — Z7951 Long term (current) use of inhaled steroids: Secondary | ICD-10-CM | POA: Insufficient documentation

## 2019-04-09 DIAGNOSIS — Z791 Long term (current) use of non-steroidal anti-inflammatories (NSAID): Secondary | ICD-10-CM | POA: Diagnosis not present

## 2019-04-09 DIAGNOSIS — G709 Myoneural disorder, unspecified: Secondary | ICD-10-CM | POA: Diagnosis not present

## 2019-04-09 DIAGNOSIS — Z6841 Body Mass Index (BMI) 40.0 and over, adult: Secondary | ICD-10-CM | POA: Diagnosis not present

## 2019-04-09 DIAGNOSIS — Z8349 Family history of other endocrine, nutritional and metabolic diseases: Secondary | ICD-10-CM | POA: Diagnosis not present

## 2019-04-09 HISTORY — PX: COLONOSCOPY WITH PROPOFOL: SHX5780

## 2019-04-09 HISTORY — PX: POLYPECTOMY: SHX5525

## 2019-04-09 SURGERY — COLONOSCOPY WITH PROPOFOL
Anesthesia: Monitor Anesthesia Care

## 2019-04-09 MED ORDER — PROPOFOL 10 MG/ML IV BOLUS
INTRAVENOUS | Status: AC
  Filled 2019-04-09: qty 40

## 2019-04-09 MED ORDER — KETAMINE HCL 50 MG/5ML IJ SOSY
PREFILLED_SYRINGE | INTRAMUSCULAR | Status: AC
  Filled 2019-04-09: qty 5

## 2019-04-09 MED ORDER — PROPOFOL 10 MG/ML IV BOLUS
INTRAVENOUS | Status: DC | PRN
  Administered 2019-04-09: 10 mg via INTRAVENOUS

## 2019-04-09 MED ORDER — PROPOFOL 500 MG/50ML IV EMUL
INTRAVENOUS | Status: DC | PRN
  Administered 2019-04-09: 150 ug/kg/min via INTRAVENOUS

## 2019-04-09 MED ORDER — HYDROCODONE-ACETAMINOPHEN 7.5-325 MG PO TABS: 1.0000 | ORAL_TABLET | Freq: Once | ORAL | Status: DC | PRN

## 2019-04-09 MED ORDER — CHLORHEXIDINE GLUCONATE CLOTH 2 % EX PADS: 6.0000 | MEDICATED_PAD | Freq: Once | CUTANEOUS | Status: DC

## 2019-04-09 MED ORDER — LACTATED RINGERS IV SOLN: INTRAVENOUS | Status: DC

## 2019-04-09 MED ORDER — LACTATED RINGERS IV SOLN
INTRAVENOUS | Status: DC
  Administered 2019-04-09: 10:00:00 via INTRAVENOUS

## 2019-04-09 MED ORDER — MEPERIDINE HCL 50 MG/ML IJ SOLN: 6.2500 mg | INTRAMUSCULAR | Status: DC | PRN

## 2019-04-09 MED ORDER — PROMETHAZINE HCL 25 MG/ML IJ SOLN: 6.2500 mg | INTRAMUSCULAR | Status: DC | PRN

## 2019-04-09 MED ORDER — HYDROMORPHONE HCL 1 MG/ML IJ SOLN: 0.2500 mg | INTRAMUSCULAR | Status: DC | PRN

## 2019-04-09 NOTE — Anesthesia Postprocedure Evaluation (Signed)
Anesthesia Post Note  Patient: Rachel Pearson  Procedure(s) Performed: COLONOSCOPY WITH PROPOFOL (N/A ) POLYPECTOMY  Patient location during evaluation: PACU Anesthesia Type: MAC Level of consciousness: awake and alert and oriented Pain management: pain level controlled Vital Signs Assessment: post-procedure vital signs reviewed and stable Respiratory status: spontaneous breathing and respiratory function stable Cardiovascular status: stable Postop Assessment: no apparent nausea or vomiting Anesthetic complications: no     Last Vitals:  Vitals:   04/09/19 0856 04/09/19 1036  BP: (!) 143/75 135/73  Pulse: 81 94  Resp: 16 (!) 21  Temp: 36.8 C (P) 36.6 C  SpO2: 98% 96%    Last Pain:  Vitals:   04/09/19 1012  TempSrc:   PainSc: 0-No pain                 ADAMS, AMY A

## 2019-04-09 NOTE — H&P (Signed)
@LOGO @   Primary Care Physician:  Terald Sleeper, PA-C Primary Gastroenterologist:  Dr. Gala Romney  Pre-Procedure History & Physical: HPI:  Rachel Pearson is a 53 y.o. female is here for a screening colonoscopy.  First ever colonoscopy.  No family history of colon cancer.  Past Medical History:  Diagnosis Date  . Allergy   . Anemia   . Hypertension     Past Surgical History:  Procedure Laterality Date  . EYE SURGERY Bilateral    x 2 (bilateral)  . ROTATOR CUFF REPAIR Left    removal of bone spurs  . TUBAL LIGATION      Prior to Admission medications   Medication Sig Start Date End Date Taking? Authorizing Provider  Cholecalciferol (VITAMIN D-3) 5000 UNITS TABS Take 5,000 Units by mouth daily.    Yes [provider]  diclofenac sodium (VOLTAREN) 1 % GEL Apply 1 application topically 2 (two) times daily as needed (shoulder pain).  06/15/18  Yes [provider]  hydrochlorothiazide (HYDRODIURIL) 25 MG tablet Take 1 tablet (25 mg total) by mouth daily. 04/04/18  Yes Terald Sleeper, PA-C  loratadine (EQ ALLERGY RELIEF) 10 MG tablet Take 1 tablet (10 mg total) by mouth daily. Patient taking differently: Take 10 mg by mouth daily as needed for allergies.  12/30/18  Yes Terald Sleeper, PA-C  losartan (COZAAR) 50 MG tablet Take 1 tablet (50 mg total) by mouth daily. 04/04/18  Yes Terald Sleeper, PA-C  Multiple Vitamins-Minerals (CENTURY-VITE PO) Take 1 tablet by mouth daily.    Yes [provider]  clotrimazole (LOTRIMIN) 1 % cream Apply 1 application topically 2 (two) times daily. Patient taking differently: Apply 1 application topically 2 (two) times daily as needed (irritation).  06/04/18   Eustaquio Maize, MD  fluticasone (FLONASE) 50 MCG/ACT nasal spray Place 2 sprays into both nostrils daily. Patient taking differently: Place 2 sprays into both nostrils daily as needed for allergies.  10/18/17   Terald Sleeper, PA-C  gabapentin (NEURONTIN) 100 MG capsule Take 1  capsule (100 mg total) by mouth 3 (three) times daily. 10/11/16   Terald Sleeper, PA-C    Allergies as of 12/12/2018  . (No Known Allergies)    Family History  Problem Relation Age of Onset  . Leukemia Mother   . Diabetes Mother   . Hypertension Mother   . Diabetes Father   . Hypertension Father   . Thyroid disease Sister   . Hypertension Brother   . Hypertension Brother   . Colon cancer Neg Hx     Social History   Socioeconomic History  . Marital status: Married    Spouse name: Not on file  . Number of children: Not on file  . Years of education: Not on file  . Highest education level: Not on file  Occupational History  . Not on file  Social Needs  . Financial resource strain: Not on file  . Food insecurity    Worry: Not on file    Inability: Not on file  . Transportation needs    Medical: Not on file    Non-medical: Not on file  Tobacco Use  . Smoking status: Never Smoker  . Smokeless tobacco: Never Used  Substance and Sexual Activity  . Alcohol use: No  . Drug use: No  . Sexual activity: Yes  Lifestyle  . Physical activity    Days per week: Not on file    Minutes per session: Not on file  .  Stress: Not on file  Relationships  . Social Musicianconnections    Talks on phone: Not on file    Gets together: Not on file    Attends religious service: Not on file    Active member of club or organization: Not on file    Attends meetings of clubs or organizations: Not on file    Relationship status: Not on file  . Intimate partner violence    Fear of current or ex partner: Not on file    Emotionally abused: Not on file    Physically abused: Not on file    Forced sexual activity: Not on file  Other Topics Concern  . Not on file  Social History Narrative  . Not on file    Review of Systems: See HPI, otherwise negative ROS  Physical Exam: BP (!) 143/75   Pulse 81   Temp 98.3 F (36.8 C) (Oral)   Resp 16   Ht 5\' 2"  (1.575 m)   Wt 108.9 kg   LMP 04/03/2019  (Exact Date)   SpO2 98%   BMI 43.90 kg/m  General:   Alert,  Well-developed, well-nourished, pleasant and cooperative in NAD Head:  Normocephalic and atraumatic. Lungs:  Clear throughout to auscultation.   No wheezes, crackles, or rhonchi. No acute distress. Heart:  Regular rate and rhythm; no murmurs, clicks, rubs,  or gallops. Abdomen:  Soft, nontender and nondistended. No masses, hepatosplenomegaly or hernias noted. Normal bowel sounds, without guarding, and without rebound.    Impression/Plan: Rachel Pearson is now here to undergo a screening colonoscopy.  First ever average rescreening examination.  Risks, benefits, limitations, imponderables and alternatives regarding colonoscopy have been reviewed with the patient. Questions have been answered. All parties agreeable.     Notice:  This dictation was prepared with Dragon dictation along with smaller phrase technology. Any transcriptional errors that result from this process are unintentional and may not be corrected upon review.

## 2019-04-09 NOTE — Op Note (Signed)
[facility] Patient Name: Rachel Pearson Procedure Date: 04/09/2019 10:04 AM MRN: [id number] Date of Birth: 10-Apr-1966 Attending MD: Gennette Pacobert Michael Caron Ode , MD CSN: [id number] Age: 53 Admit Type: Outpatient Procedure:                Colonoscopy Indications:              Screening for colorectal malignant neoplasm Providers:                Gennette Pacobert Michael Ivet Guerrieri, MD, Buel ReamAngela A. Thomasena Edisollins RN, RN,                            Pandora LeiterNeville David, Technician Referring MD:              Medicines:                Propofol per Anesthesia Complications:            No immediate complications. Estimated Blood Loss:     Estimated blood loss was minimal. Procedure:                Pre-Anesthesia Assessment:                           - Prior to the procedure, a History and Physical                            was performed, and patient medications and                            allergies were reviewed. The patient's tolerance of                            previous anesthesia was also reviewed. The risks                            and benefits of the procedure and the sedation                            options and risks were discussed with the patient.                            All questions were answered, and informed consent                            was obtained. Prior Anticoagulants: The patient has                            taken no previous anticoagulant or antiplatelet                            agents. ASA Grade Assessment: II - A patient with                            mild systemic disease. After reviewing the risks  and benefits, the patient was deemed in                            satisfactory condition to undergo the procedure.                           After obtaining informed consent, the colonoscope                            was passed under direct vision. Throughout the                            procedure, the patient's blood pressure, pulse, and             oxygen saturations were monitored continuously. The                            CF-[id number] ([id number] scope was introduced through                            the anus and advanced to the the cecum, identified                            by appendiceal orifice and ileocecal valve. The                            colonoscopy was performed without difficulty. The                            patient tolerated the procedure well. The quality                            of the bowel preparation was adequate. Scope In: 10:16:39 AM Scope Out: 10:30:13 AM Scope Withdrawal Time: 0 hours 10 minutes 20 seconds  Total Procedure Duration: 0 hours 13 minutes 34 seconds  Findings:      The perianal and digital rectal examinations were normal.      A 4 mm polyp was found in the rectum. The polyp was sessile. The polyp       was removed with a cold snare. Resection and retrieval were complete.       Estimated blood loss was minimal.      Scattered medium-mouthed diverticula were found in the sigmoid colon and       descending colon.      The exam was otherwise without abnormality on direct and retroflexion       views. Impression:               - One 4 mm polyp in the rectum, removed with a cold                            snare. Resected and retrieved.                           - Diverticulosis in the sigmoid colon and in the  descending colon.                           - The examination was otherwise normal on direct                            and retroflexion views. Moderate Sedation:      Moderate (conscious) sedation was personally administered by an       anesthesia professional. The following parameters were monitored: oxygen       saturation, heart rate, blood pressure, respiratory rate, EKG, adequacy       of pulmonary ventilation, and response to care. Recommendation:           - Patient has a contact number available for                            emergencies. The  signs and symptoms of potential                            delayed complications were discussed with the                            patient. Return to normal activities tomorrow.                            Written discharge instructions were provided to the                            patient.                           - Resume previous diet.                           - Continue present medications.                           - Repeat colonoscopy date to be determined after                            pending pathology results are reviewed for                            surveillance based on pathology results.                           - Return to GI office PRN. Procedure Code(s):        --- Professional ---                           [phone], Colonoscopy, flexible; with removal of                            tumor(s), polyp(s), or other lesion(s) by snare  technique Diagnosis Code(s):        --- Professional ---                           Z12.11, Encounter for screening for malignant                            neoplasm of colon                           K62.1, Rectal polyp                           K57.30, Diverticulosis of large intestine without                            perforation or abscess without bleeding CPT copyright 2019 American [employer] Association. All rights reserved. The codes documented in this report are preliminary and upon coder review may  be revised to meet current compliance requirements. Cristopher Estimable. Eathen Budreau, MD Norvel Richards, MD 04/09/2019 10:41:30 AM This report has been signed electronically. Number of Addenda: 0

## 2019-04-09 NOTE — Anesthesia Preprocedure Evaluation (Signed)
Anesthesia Evaluation    Airway Mallampati: II       Dental  (+) Poor Dentition, Loose   Pulmonary    breath sounds clear to auscultation       Cardiovascular hypertension,  Rhythm:regular     Neuro/Psych  Neuromuscular disease    GI/Hepatic   Endo/Other    Renal/GU      Musculoskeletal   Abdominal   Peds  Hematology  (+) Blood dyscrasia, anemia ,   Anesthesia Other Findings Morbid obesity Misaligned and loose uper/lower front teeth  Reproductive/Obstetrics                             Anesthesia Physical Anesthesia Plan  ASA: III  Anesthesia Plan: MAC   Post-op Pain Management:    Induction:   PONV Risk Score and Plan:   Airway Management Planned:   Additional Equipment:   Intra-op Plan:   Post-operative Plan:   Informed Consent: I have reviewed the patients History and Physical, chart, labs and discussed the procedure including the risks, benefits and alternatives for the proposed anesthesia with the patient or authorized representative who has indicated his/her understanding and acceptance.       Plan Discussed with: Anesthesiologist  Anesthesia Plan Comments:         Anesthesia Quick Evaluation

## 2019-04-09 NOTE — Discharge Instructions (Signed)
Colonoscopy Discharge Instructions  Read the instructions outlined below and refer to this sheet in the next few weeks. These discharge instructions provide you with general information on caring for yourself after you leave the hospital. Your doctor may also give you specific instructions. While your treatment has been planned according to the most current medical practices available, unavoidable complications occasionally occur. If you have any problems or questions after discharge, call Dr. Gala Romney at [phone]. ACTIVITY  You may resume your regular activity, but move at a slower pace for the next 24 hours.   Take frequent rest periods for the next 24 hours.   Walking will help get rid of the air and reduce the bloated feeling in your belly (abdomen).   No driving for 24 hours (because of the medicine (anesthesia) used during the test).    Do not sign any important legal documents or operate any machinery for 24 hours (because of the anesthesia used during the test).  NUTRITION  Drink plenty of fluids.   You may resume your normal diet as instructed by your doctor.   Begin with a light meal and progress to your normal diet. Heavy or fried foods are harder to digest and may make you feel sick to your stomach (nauseated).   Avoid alcoholic beverages for 24 hours or as instructed.  MEDICATIONS  You may resume your normal medications unless your doctor tells you otherwise.  WHAT YOU CAN EXPECT TODAY  Some feelings of bloating in the abdomen.   Passage of more gas than usual.   Spotting of blood in your stool or on the toilet paper.  IF YOU HAD POLYPS REMOVED DURING THE COLONOSCOPY:  No aspirin products for 7 days or as instructed.   No alcohol for 7 days or as instructed.   Eat a soft diet for the next 24 hours.  FINDING OUT THE RESULTS OF YOUR TEST Not all test results are available during your visit. If your test results are not back during the visit, make an appointment  with your caregiver to find out the results. Do not assume everything is normal if you have not heard from your caregiver or the medical facility. It is important for you to follow up on all of your test results.  SEEK IMMEDIATE MEDICAL ATTENTION IF:  You have more than a spotting of blood in your stool.   Your belly is swollen (abdominal distention).   You are nauseated or vomiting.   You have a temperature over 101.   You have abdominal pain or discomfort that is severe or gets worse throughout the day.    Colon polyp and diverticulosis information provided  Further recommendations to follow pending review of pathology report  At patient request, I called husband, Roderic Palau, at [phone]  -  got no answer.   Diverticulosis  Diverticulosis is a condition that develops when small pouches (diverticula) form in the [place] of the large intestine (colon). The colon is where water is absorbed and stool is formed. The pouches form when the inside layer of the colon pushes through weak spots in the outer layers of the colon. You may have a few pouches or many of them. What are the causes? The cause of this condition is not known. What increases the risk? The following factors may make you more likely to develop this condition:  Being older than age 76. Your risk for this condition increases with age. Diverticulosis is rare among people younger than age 63. By age  80, many people have it.  Eating a low-fiber diet.  Having frequent constipation.  Being overweight.  Not getting enough exercise.  Smoking.  Taking over-the-counter pain medicines, like aspirin and ibuprofen.  Having a family history of diverticulosis. What are the signs or symptoms? In most people, there are no symptoms of this condition. If you do have symptoms, they may include:  Bloating.  Cramps in the abdomen.  Constipation or diarrhea.  Pain in the lower left side of the abdomen. How is this  diagnosed? This condition is most often diagnosed during an exam for other colon problems. Because diverticulosis usually has no symptoms, it often cannot be diagnosed independently. This condition may be diagnosed by:  Using a flexible scope to examine the colon (colonoscopy).  Taking an X-ray of the colon after dye has been put into the colon (barium enema).  Doing a CT scan. How is this treated? You may not need treatment for this condition if you have never developed an infection related to diverticulosis. If you have had an infection before, treatment may include:  Eating a high-fiber diet. This may include eating more fruits, vegetables, and grains.  Taking a fiber supplement.  Taking a live bacteria supplement (probiotic).  Taking medicine to relax your colon.  Taking antibiotic medicines. Follow these instructions at home:  Drink 6-8 glasses of water or more each day to prevent constipation.  Try not to strain when you have a bowel movement.  If you have had an infection before: ? Eat more fiber as directed by your health care provider or your diet and nutrition specialist (dietitian). ? Take a fiber supplement or probiotic, if your health care provider approves.  Take over-the-counter and prescription medicines only as told by your health care provider.  If you were prescribed an antibiotic, take it as told by your health care provider. Do not stop taking the antibiotic even if you start to feel better.  Keep all follow-up visits as told by your health care provider. This is important. Contact a health care provider if:  You have pain in your abdomen.  You have bloating.  You have cramps.  You have not had a bowel movement in 3 days. Get help right away if:  Your pain gets worse.  Your bloating becomes very bad.  You have a fever or chills, and your symptoms suddenly get worse.  You vomit.  You have bowel movements that are bloody or black.  You have  bleeding from your rectum. Summary  Diverticulosis is a condition that develops when small pouches (diverticula) form in the [place] of the large intestine (colon).  You may have a few pouches or many of them.  This condition is most often diagnosed during an exam for other colon problems.  If you have had an infection related to diverticulosis, treatment may include increasing the fiber in your diet, taking supplements, or taking medicines. This information is not intended to replace advice given to you by your health care provider. Make sure you discuss any questions you have with your health care provider. Document Released: 06/28/2004 Document Revised: 08/20/2016 Document Reviewed: 08/20/2016 Elsevier Interactive Patient Education  2019 [employer] Inc[employer]  Colon Polyps  Polyps are tissue growths inside the body. Polyps can grow in many places, including the large intestine (colon). A polyp may be a round bump or a mushroom-shaped growth. You could have one polyp or several. Most colon polyps are noncancerous (benign). However, some colon polyps can become cancerous over  time. Finding and removing the polyps early can help prevent this. What are the causes? The exact cause of colon polyps is not known. What increases the risk? You are more likely to develop this condition if you:  Have a family history of colon cancer or colon polyps.  Are older than 1150 or older than 45 if you are African American.  Have inflammatory bowel disease, such as ulcerative colitis or Crohn's disease.  Have certain hereditary conditions, such as: ? Familial adenomatous polyposis. ? Lynch syndrome. ? Turcot syndrome. ? Peutz-Jeghers syndrome.  Are overweight.  Smoke cigarettes.  Do not get enough exercise.  Drink too much alcohol.  Eat a diet that is high in fat and red meat and low in fiber.  Had childhood cancer that was treated with abdominal radiation. What are the signs or symptoms? Most  polyps do not cause symptoms. If you have symptoms, they may include:  Blood coming from your rectum when having a bowel movement.  Blood in your stool. The stool may look dark red or black.  Abdominal pain.  A change in bowel habits, such as constipation or diarrhea. How is this diagnosed? This condition is diagnosed with a colonoscopy. This is a procedure in which a lighted, flexible scope is inserted into the anus and then passed into the colon to examine the area. Polyps are sometimes found when a colonoscopy is done as part of routine cancer screening tests. How is this treated? Treatment for this condition involves removing any polyps that are found. Most polyps can be removed during a colonoscopy. Those polyps will then be tested for cancer. Additional treatment may be needed depending on the results of testing. Follow these instructions at home: Lifestyle  Maintain a healthy weight, or lose weight if recommended by your health care provider.  Exercise every day or as told by your health care provider.  Do not use any products that contain nicotine or tobacco, such as cigarettes and e-cigarettes. If you need help quitting, ask your health care provider.  If you drink alcohol, limit how much you have: ? 0-1 drink a day for women. ? 0-2 drinks a day for men.  Be aware of how much alcohol is in your drink. In the [place] one drink equals one 12 oz bottle of beer (355 mL), one 5 oz glass of wine (148 mL), or one 1 oz shot of hard liquor (44 mL). Eating and drinking   Eat foods that are high in fiber, such as fruits, vegetables, and whole grains.  Eat foods that are high in calcium and vitamin D, such as milk, cheese, yogurt, eggs, liver, fish, and broccoli.  Limit foods that are high in fat, such as fried foods and desserts.  Limit the amount of red meat and processed meat you eat, such as hot dogs, sausage, bacon, and lunch meats. General instructions  Keep all follow-up  visits as told by your health care provider. This is important. ? This includes having regularly scheduled colonoscopies. ? Talk to your health care provider about when you need a colonoscopy. Contact a health care provider if:  You have new or worsening bleeding during a bowel movement.  You have new or increased blood in your stool.  You have a change in bowel habits.  You lose weight for no known reason. Summary  Polyps are tissue growths inside the body. Polyps can grow in many places, including the colon.  Most colon polyps are noncancerous (benign), but some can  become cancerous over time.  This condition is diagnosed with a colonoscopy.  Treatment for this condition involves removing any polyps that are found. Most polyps can be removed during a colonoscopy. This information is not intended to replace advice given to you by your health care provider. Make sure you discuss any questions you have with your health care provider. Document Released: 06/27/2004 Document Revised: 01/16/2018 Document Reviewed: 01/16/2018 Elsevier Interactive Patient Education  2019 [employer]

## 2019-04-09 NOTE — Transfer of Care (Signed)
Immediate Anesthesia Transfer of Care Note  Patient: ZANOVIA ROTZ  Procedure(s) Performed: COLONOSCOPY WITH PROPOFOL (N/A ) POLYPECTOMY  Patient Location: PACU  Anesthesia Type:MAC  Level of Consciousness: awake, alert , oriented and patient cooperative  Airway & Oxygen Therapy: Patient Spontanous Breathing  Post-op Assessment: Report given to RN and Post -op Vital signs reviewed and stable  Post vital signs: Reviewed and stable  Last Vitals:  Vitals Value Taken Time  BP 135/73 04/09/19 1036  Temp    Pulse 95 04/09/19 1037  Resp 17 04/09/19 1037  SpO2 97 % 04/09/19 1037  Vitals shown include unvalidated device data.  Last Pain:  Vitals:   04/09/19 1012  TempSrc:   PainSc: 0-No pain         Complications: No apparent anesthesia complications

## 2019-04-10 ENCOUNTER — Encounter: Payer: Self-pay | Admitting: Internal Medicine

## 2019-04-15 ENCOUNTER — Encounter (HOSPITAL_COMMUNITY): Payer: Self-pay | Admitting: Internal Medicine

## 2019-04-16 ENCOUNTER — Other Ambulatory Visit: Payer: Self-pay | Admitting: Physician Assistant

## 2019-06-01 ENCOUNTER — Other Ambulatory Visit: Payer: Self-pay

## 2019-06-03 ENCOUNTER — Encounter: Payer: Self-pay | Admitting: Physician Assistant

## 2019-06-03 ENCOUNTER — Other Ambulatory Visit: Payer: Self-pay

## 2019-06-03 ENCOUNTER — Ambulatory Visit: Payer: Managed Care, Other (non HMO) | Admitting: Physician Assistant

## 2019-06-03 VITALS — BP 124/76 | HR 80 | Temp 98.0°F | Ht 62.0 in | Wt 241.6 lb

## 2019-06-03 DIAGNOSIS — I10 Essential (primary) hypertension: Secondary | ICD-10-CM | POA: Diagnosis not present

## 2019-06-03 DIAGNOSIS — B353 Tinea pedis: Secondary | ICD-10-CM | POA: Diagnosis not present

## 2019-06-03 DIAGNOSIS — Z23 Encounter for immunization: Secondary | ICD-10-CM | POA: Diagnosis not present

## 2019-06-03 DIAGNOSIS — Z0000 Encounter for general adult medical examination without abnormal findings: Secondary | ICD-10-CM

## 2019-06-03 MED ORDER — FLUTICASONE PROPIONATE 50 MCG/ACT NA SUSP: 2.0000 | Freq: Every day | NASAL | 3 refills | Status: AC | PRN

## 2019-06-03 MED ORDER — HYDROCHLOROTHIAZIDE 25 MG PO TABS: 25.0000 mg | ORAL_TABLET | Freq: Every day | ORAL | 3 refills | Status: DC

## 2019-06-03 MED ORDER — CLOTRIMAZOLE 1 % EX CREA: 1.0000 "application " | TOPICAL_CREAM | Freq: Two times a day (BID) | CUTANEOUS | 5 refills | Status: AC | PRN

## 2019-06-03 MED ORDER — LOSARTAN POTASSIUM 50 MG PO TABS: 50.0000 mg | ORAL_TABLET | Freq: Every day | ORAL | 3 refills | Status: DC

## 2019-06-03 MED ORDER — GABAPENTIN 100 MG PO CAPS: 100.0000 mg | ORAL_CAPSULE | Freq: Three times a day (TID) | ORAL | 3 refills | Status: DC

## 2019-06-03 NOTE — Progress Notes (Signed)
BP 124/76   Pulse 80   Temp 98 F (36.7 C) (Temporal)   Ht 5\' 2"  (1.575 m)   Wt 241 lb 9.6 oz (109.6 kg)   BMI 44.19 kg/m    Subjective:    Patient ID: Tamera Punt, female    DOB: 09-Feb-1966, 54 y.o.   MRN: [id number]  HPI: JADEA SHIFFER is a 53 y.o. female presenting on 06/03/2019 for Hypertension (check up of chronic medical conditions)  This patient comes in for recheck of her chronic medical conditions.  She does have well-controlled hypertension.  She does have some issues with degenerative disc and she has had a longstanding history of anemia.  Everything is well controlled at this time.  We have been using gabapentin she is up to 200mg  at a time and states that it has been helping some.  Again her blood pressure has been very well controlled.  She has longstanding allergies.  Flonase seems to help she does take Claritin over-the-counter.  She has had her well labs performed in February 2020.  And they all were very good. Past Medical History:  Diagnosis Date  . Allergy   . Anemia   . Hypertension    Relevant past medical, surgical, family and social history reviewed and updated as indicated. Interim medical history since our last visit reviewed. Allergies and medications reviewed and updated. DATA REVIEWED: CHART IN EPIC  Family History reviewed for pertinent findings.  Review of Systems  Constitutional: Negative.   HENT: Negative.   Eyes: Negative.   Respiratory: Negative.   Gastrointestinal: Negative.   Genitourinary: Negative.     Allergies as of 06/03/2019   No Known Allergies     Medication List       Accurate as of June 03, 2019  9:43 AM. If you have any questions, ask your nurse or doctor.        CENTURY-VITE PO Take 1 tablet by mouth daily.   clotrimazole 1 % cream Commonly known as: LOTRIMIN Apply 1 application topically 2 (two) times daily as needed (irritation).   diclofenac sodium 1 % Gel Commonly known as: VOLTAREN Apply 1  application topically 2 (two) times daily as needed (shoulder pain).   fluticasone 50 MCG/ACT nasal spray Commonly known as: FLONASE Place 2 sprays into both nostrils daily as needed for allergies.   gabapentin 100 MG capsule Commonly known as: NEURONTIN Take 1 capsule (100 mg total) by mouth 3 (three) times daily.   hydrochlorothiazide 25 MG tablet Commonly known as: HYDRODIURIL Take 1 tablet (25 mg total) by mouth daily.   loratadine 10 MG tablet Commonly known as: EQ Allergy Relief Take 1 tablet (10 mg total) by mouth daily. What changed:   when to take this  reasons to take this   losartan 50 MG tablet Commonly known as: COZAAR Take 1 tablet (50 mg total) by mouth daily.   Vitamin D-3 125 MCG (5000 UT) Tabs Take 5,000 Units by mouth daily.          Objective:    BP 124/76   Pulse 80   Temp 98 F (36.7 C) (Temporal)   Ht 5\' 2"  (0.086 m)   Wt 241 lb 9.6 oz (109.6 kg)   BMI 44.19 kg/m   No Known Allergies  Wt Readings from Last 3 Encounters:  06/03/19 241 lb 9.6 oz (109.6 kg)  04/09/19 240 lb (108.9 kg)  04/03/19 239 lb (108.4 kg)    Physical Exam Constitutional:  General: She is not in acute distress.    Appearance: Normal appearance. She is well-developed.  HENT:     Head: Normocephalic and atraumatic.  Cardiovascular:     Rate and Rhythm: Normal rate.  Pulmonary:     Effort: Pulmonary effort is normal.  Skin:    General: Skin is warm and dry.     Findings: No rash.  Neurological:     Mental Status: She is alert and oriented to person, place, and time.     Deep Tendon Reflexes: Reflexes are normal and symmetric.     Results for orders placed or performed during the hospital encounter of 04/06/19  Novel Coronavirus, NAA (hospital order; send-out to ref lab)   Specimen: Nasopharyngeal Swab; Respiratory  Result Value Ref Range   SARS-CoV-2, NAA NOT DETECTED NOT DETECTED   Coronavirus Source NASOPHARYNGEAL       Assessment & Plan:    1. Tinea pedis of both feet - clotrimazole (LOTRIMIN) 1 % cream; Apply 1 application topically 2 (two) times daily as needed (irritation).  Dispense: 60 g; Refill: 5  2. Well adult exam - fluticasone (FLONASE) 50 MCG/ACT nasal spray; Place 2 sprays into both nostrils daily as needed for allergies.  Dispense: 48 g; Refill: 3 - gabapentin (NEURONTIN) 100 MG capsule; Take 1 capsule (100 mg total) by mouth 3 (three) times daily.  Dispense: 270 capsule; Refill: 3 - Tdap vaccine greater than or equal to 7yo IM  3. Essential hypertension - hydrochlorothiazide (HYDRODIURIL) 25 MG tablet; Take 1 tablet (25 mg total) by mouth daily.  Dispense: 90 tablet; Refill: 3 - losartan (COZAAR) 50 MG tablet; Take 1 tablet (50 mg total) by mouth daily.  Dispense: 90 tablet; Refill: 3   Continue all other maintenance medications as listed above.  Follow up plan: No follow-ups on file.  Educational handout given for survey  Remus LofflerAngel S. Naela Nodal PA-C [facility] [address]  [place], [place] [id number] [phone]   06/03/2019, 9:43 AM

## 2019-09-04 ENCOUNTER — Other Ambulatory Visit (HOSPITAL_COMMUNITY): Payer: Self-pay | Admitting: Physician Assistant

## 2019-09-04 DIAGNOSIS — Z1231 Encounter for screening mammogram for malignant neoplasm of breast: Secondary | ICD-10-CM

## 2019-09-24 ENCOUNTER — Other Ambulatory Visit: Payer: Self-pay

## 2019-09-24 ENCOUNTER — Ambulatory Visit (HOSPITAL_COMMUNITY)
Admission: RE | Admit: 2019-09-24 | Discharge: 2019-09-24 | Disposition: A | Discharge status: Home / Self Care | Payer: BC Managed Care – PPO | Source: Ambulatory Visit | Attending: Physician Assistant | Admitting: Physician Assistant

## 2019-09-24 DIAGNOSIS — Z1231 Encounter for screening mammogram for malignant neoplasm of breast: Secondary | ICD-10-CM | POA: Insufficient documentation

## 2019-09-28 ENCOUNTER — Other Ambulatory Visit (HOSPITAL_COMMUNITY): Payer: Self-pay | Admitting: Physician Assistant

## 2019-09-28 DIAGNOSIS — R928 Other abnormal and inconclusive findings on diagnostic imaging of breast: Secondary | ICD-10-CM

## 2019-09-29 ENCOUNTER — Ambulatory Visit (HOSPITAL_COMMUNITY)
Admission: RE | Admit: 2019-09-29 | Discharge: 2019-09-29 | Disposition: A | Discharge status: Home / Self Care | Payer: BC Managed Care – PPO | Source: Ambulatory Visit | Attending: Physician Assistant | Admitting: Physician Assistant

## 2019-09-29 ENCOUNTER — Other Ambulatory Visit: Payer: Self-pay

## 2019-09-29 ENCOUNTER — Ambulatory Visit (HOSPITAL_COMMUNITY): Admission: RE | Admit: 2019-09-29 | Payer: BC Managed Care – PPO | Source: Ambulatory Visit

## 2019-09-29 DIAGNOSIS — R928 Other abnormal and inconclusive findings on diagnostic imaging of breast: Secondary | ICD-10-CM

## 2019-11-05 ENCOUNTER — Other Ambulatory Visit: Payer: Self-pay

## 2019-11-06 ENCOUNTER — Ambulatory Visit (INDEPENDENT_AMBULATORY_CARE_PROVIDER_SITE_OTHER): Payer: BC Managed Care – PPO

## 2019-11-06 ENCOUNTER — Ambulatory Visit (INDEPENDENT_AMBULATORY_CARE_PROVIDER_SITE_OTHER): Payer: BC Managed Care – PPO | Admitting: Physician Assistant

## 2019-11-06 ENCOUNTER — Encounter: Payer: Self-pay | Admitting: Physician Assistant

## 2019-11-06 VITALS — BP 142/84 | HR 83 | Temp 98.7°F | Ht 62.0 in | Wt 245.0 lb

## 2019-11-06 DIAGNOSIS — M545 Low back pain, unspecified: Secondary | ICD-10-CM

## 2019-11-06 DIAGNOSIS — L300 Nummular dermatitis: Secondary | ICD-10-CM

## 2019-11-06 MED ORDER — MELOXICAM 7.5 MG PO TABS: 7.5000 mg | ORAL_TABLET | Freq: Every day | ORAL | 0 refills | Status: DC

## 2019-11-06 MED ORDER — CLOBETASOL PROPIONATE 0.05 % EX CREA: 1.0000 "application " | TOPICAL_CREAM | Freq: Two times a day (BID) | CUTANEOUS | 2 refills | Status: DC

## 2019-11-06 NOTE — Patient Instructions (Addendum)
Back Exercises These exercises help to make your trunk and back strong. They also help to keep the lower back flexible. Doing these exercises can help to prevent back pain or lessen existing pain.  If you have back pain, try to do these exercises 2-3 times each day or as told by your doctor.  As you get better, do the exercises once each day. Repeat the exercises more often as told by your doctor.  To stop back pain from coming back, do the exercises once each day, or as told by your doctor. Exercises Single knee to chest Do these steps 3-5 times in a row for each leg: 1. Lie on your back on a firm bed or the floor with your legs stretched out. 2. Bring one knee to your chest. 3. Grab your knee or thigh with both hands and hold them it in place. 4. Pull on your knee until you feel a gentle stretch in your lower back or buttocks. 5. Keep doing the stretch for 10-30 seconds. 6. Slowly let go of your leg and straighten it. Pelvic tilt Do these steps 5-10 times in a row: 1. Lie on your back on a firm bed or the floor with your legs stretched out. 2. Bend your knees so they point up to the ceiling. Your feet should be flat on the floor. 3. Tighten your lower belly (abdomen) muscles to press your lower back against the floor. This will make your tailbone point up to the ceiling instead of pointing down to your feet or the floor. 4. Stay in this position for 5-10 seconds while you gently tighten your muscles and breathe evenly. Cat-cow Do these steps until your lower back bends more easily: 1. Get on your hands and knees on a firm surface. Keep your hands under your shoulders, and keep your knees under your hips. You may put padding under your knees. 2. Let your head hang down toward your chest. Tighten (contract) the muscles in your belly. Point your tailbone toward the floor so your lower back becomes rounded like the back of a cat. 3. Stay in this position for 5 seconds. 4. Slowly lift your  head. Let the muscles of your belly relax. Point your tailbone up toward the ceiling so your back forms a sagging arch like the back of a cow. 5. Stay in this position for 5 seconds.  Press-ups Do these steps 5-10 times in a row: 1. Lie on your belly (face-down) on the floor. 2. Place your hands near your head, about shoulder-width apart. 3. While you keep your back relaxed and keep your hips on the floor, slowly straighten your arms to raise the top half of your body and lift your shoulders. Do not use your back muscles. You may change where you place your hands in order to make yourself more comfortable. 4. Stay in this position for 5 seconds. 5. Slowly return to lying flat on the floor.  Bridges Do these steps 10 times in a row: 1. Lie on your back on a firm surface. 2. Bend your knees so they point up to the ceiling. Your feet should be flat on the floor. Your arms should be flat at your sides, next to your body. 3. Tighten your butt muscles and lift your butt off the floor until your waist is almost as high as your knees. If you do not feel the muscles working in your butt and the back of your thighs, slide your feet 1-2 inches   farther away from your butt. 4. Stay in this position for 3-5 seconds. 5. Slowly lower your butt to the floor, and let your butt muscles relax. If this exercise is too easy, try doing it with your arms crossed over your chest. Belly crunches Do these steps 5-10 times in a row: 1. Lie on your back on a firm bed or the floor with your legs stretched out. 2. Bend your knees so they point up to the ceiling. Your feet should be flat on the floor. 3. Cross your arms over your chest. 4. Tip your chin a little bit toward your chest but do not bend your neck. 5. Tighten your belly muscles and slowly raise your chest just enough to lift your shoulder blades a tiny bit off of the floor. Avoid raising your body higher than that, because it can put too much stress on your low  back. 6. Slowly lower your chest and your head to the floor. Back lifts Do these steps 5-10 times in a row: 1. Lie on your belly (face-down) with your arms at your sides, and rest your forehead on the floor. 2. Tighten the muscles in your legs and your butt. 3. Slowly lift your chest off of the floor while you keep your hips on the floor. Keep the back of your head in line with the curve in your back. Look at the floor while you do this. 4. Stay in this position for 3-5 seconds. 5. Slowly lower your chest and your face to the floor. Contact a doctor if:  Your back pain gets a lot worse when you do an exercise.  Your back pain does not get better 2 hours after you exercise. If you have any of these problems, stop doing the exercises. Do not do them again unless your doctor says it is okay. Get help right away if:  You have sudden, very bad back pain. If this happens, stop doing the exercises. Do not do them again unless your doctor says it is okay. This information is not intended to replace advice given to you by your health care provider. Make sure you discuss any questions you have with your health care provider. Document Revised: 06/26/2018 Document Reviewed: 06/26/2018 Elsevier Patient Education  2020 [employer] Nummular Eczema Nummular eczema is a common disease that causes red, circular, crusted (plaque) lesions that may be itchy. It most commonly affects the lower legs and the backs of hands. Men tend to get their first outbreak between 66 and 20 years of age, and women tend to get their first outbreak during their teen or young adult years. What are the causes? The cause of this condition is not known. It may be related to certain skin sensitivities, such as sensitivity to:  Metals such as nickel and, rarely, mercury.  Formaldehyde.  Antibiotic medicine that is applied to the skin. What increases the risk? You are more likely to develop this condition if:  You have very  dry skin.  You live in a place with dry and cold weather.  You have a personal or family history of eczema, asthma, or allergies.  You drink alcohol.  You have poor blood flow (circulation). What are the signs or symptoms? Symptoms most commonly affect the lower legs, but may also affect the hands, torso, arms, or feet. Symptoms include:  Groups of tiny, red spots.  Blister-like sores that leak fluid. These sores may grow together and form circular patches. After a long time, they may  become crusty and then scaly.  Well-defined patches of pink, red, or brown skin.  Itchiness and burning, ranging from mild to severe. Itchiness may be worse at night and may cause trouble sleeping. Scratching lesions can cause bleeding. How is this diagnosed? This condition is diagnosed based on a physical exam and your medical history. Usually more tests are not needed, but you may need a swab test to check for skin infection. This test involves swabbing an affected area and testing the sample for bacteria (culture). You may work with a health care provider who specializes in the skin (dermatologist) to help diagnose and treat this condition. How is this treated? There is no cure for this condition, but treatment can help relieve symptoms. Depending on how severe your symptoms are, your healthcare provider may suggest:  Medicine applied to the skin to reduce swelling and irritation (topical corticosteroids).  Medicine taken by mouth to reduce itching (oral antihistamines).  Antibiotic medicine to take by mouth (oral antibiotic) or to apply to your skin (topical antibiotic), if you have a skin infection.  Light therapy (phototherapy). This involves shining ultraviolet (UV) light on affected skin to reduce itchiness and inflammation.  Soaking in a bath that contains a type of salt that dries out blisters (potassium permanganate soaks). Follow these instructions at home: Medicines  Take  over-the-counter and prescription medicines only as told by your health care provider.  If you were prescribed an antibiotic, take or apply it as told by your health care provider. Do not stop using the antibiotic even if you start to feel better. Skin Care   Keep your fingernails short to avoid breaking open the skin when you scratch.  Wash your hands with mild soap and water to avoid infection.  Pat your skin dry after bathing or washing your hands. Avoid rubbing your skin.  Keep your skin hydrated. To do this: ? Avoid very hot water. Take lukewarm baths or showers. ? Apply moisturizer within three minutes of bathing. This locks in moisture. ? Use a humidifier when you have the heating or air conditioning on. This will add moisture to the air.  Identify and avoid things that trigger symptoms or irritate your skin. Triggers may include taking long, hot showers or baths, or not using creams or ointments to moisturize. Certain soaps may also trigger this condition. General instructions  Dress in clothes made of cotton or cotton blends. Avoid wearing clothes with wool fabric.  Avoid activities that may cause skin injury. Wear protective clothing when doing outdoor activities such as gardening or hiking. Cuts, scrapes, and insect bites can make symptoms worse.  Keep all follow-up visits as told by your health care provider. This is important. Contact a health care provider if:  You develop a yellowish crust on an area of affected skin.  You have symptoms that do not go away with treatment or home care methods. Get help right away if:  You have more redness, pain, pus, or swelling. Summary  Nummular eczema is a common disease that causes red, circular, crusted (plaque) lesions that may be itchy.  The cause of this condition is not known. It may be related to certain skin sensitivities.  Treatments may include medicines to reduce swelling and irritation, avoiding triggers, and  keeping your skin hydrated. This information is not intended to replace advice given to you by your health care provider. Make sure you discuss any questions you have with your health care provider. Document Revised: 11/27/2018 Document Reviewed: 02/14/2017  Elsevier Patient Education  [employer].

## 2019-11-08 DIAGNOSIS — L300 Nummular dermatitis: Secondary | ICD-10-CM | POA: Insufficient documentation

## 2019-11-08 DIAGNOSIS — M545 Low back pain, unspecified: Secondary | ICD-10-CM | POA: Insufficient documentation

## 2019-11-08 NOTE — Progress Notes (Signed)
Acute Office Visit  Subjective:    Patient ID: Rachel Pearson, female    DOB: 1966-01-19, 54 y.o.   MRN: [id number]  Chief Complaint  Patient presents with  . Back Pain    x 1 month  . rash on leg    Back Pain This is a chronic problem. The current episode started 1 to 4 weeks ago. The problem occurs constantly. The problem has been waxing and waning since onset. The pain is present in the lumbar spine and sacro-iliac. The pain is at a severity of 5/10. The pain is the same all the time. The symptoms are aggravated by twisting and bending. Pertinent negatives include no abdominal pain, chest pain, dysuria or fever. She has tried analgesics and heat for the symptoms.  Rash This is a new problem. The current episode started 1 to 4 weeks ago. The problem is unchanged. The affected locations include the left lower leg and right lower leg. The rash is characterized by dryness, redness and peeling. She was exposed to nothing. Pertinent negatives include no cough, fatigue or fever. Her past medical history is significant for eczema.     Past Medical History:  Diagnosis Date  . Allergy   . Anemia   . Hypertension     Past Surgical History:  Procedure Laterality Date  . COLONOSCOPY WITH PROPOFOL N/A 04/09/2019   Procedure: COLONOSCOPY WITH PROPOFOL;  Surgeon: Daneil Dolin, MD;  Location: [facility];  Service: Endoscopy;  Laterality: N/A;  7:30am  . EYE SURGERY Bilateral    x 2 (bilateral)  . POLYPECTOMY  04/09/2019   Procedure: POLYPECTOMY;  Surgeon: Daneil Dolin, MD;  Location: [facility];  Service: Endoscopy;;  rectal   . ROTATOR CUFF REPAIR Left    removal of bone spurs  . TUBAL LIGATION      Family History  Problem Relation Age of Onset  . Leukemia Mother   . Diabetes Mother   . Hypertension Mother   . Diabetes Father   . Hypertension Father   . Thyroid disease Sister   . Hypertension Brother   . Hypertension Brother   . Colon cancer Neg Hx     Social  History   Socioeconomic History  . Marital status: Married    Spouse name: Not on file  . Number of children: Not on file  . Years of education: Not on file  . Highest education level: Not on file  Occupational History  . Not on file  Tobacco Use  . Smoking status: Never Smoker  . Smokeless tobacco: Never Used  Substance and Sexual Activity  . Alcohol use: No  . Drug use: No  . Sexual activity: Yes  Other Topics Concern  . Not on file  Social History Narrative  . Not on file   Social Determinants of Health   Financial Resource Strain:   . Difficulty of Paying Living Expenses: Not on file  Food Insecurity:   . Worried About Charity fundraiser in the Last Year: Not on file  . Ran Out of Food in the Last Year: Not on file  Transportation Needs:   . Lack of Transportation (Medical): Not on file  . Lack of Transportation (Non-Medical): Not on file  Physical Activity:   . Days of Exercise per Week: Not on file  . Minutes of Exercise per Session: Not on file  Stress:   . Feeling of Stress : Not on file  Social Connections:   .  Frequency of Communication with Friends and Family: Not on file  . Frequency of Social Gatherings with Friends and Family: Not on file  . Attends Religious Services: Not on file  . Active Member of Clubs or Organizations: Not on file  . Attends Banker Meetings: Not on file  . Marital Status: Not on file  Intimate Partner Violence:   . Fear of Current or Ex-Partner: Not on file  . Emotionally Abused: Not on file  . Physically Abused: Not on file  . Sexually Abused: Not on file    Outpatient Medications Prior to Visit  Medication Sig Dispense Refill  . Cholecalciferol (VITAMIN D-3) 5000 UNITS TABS Take 5,000 Units by mouth daily.     . clotrimazole (LOTRIMIN) 1 % cream Apply 1 application topically 2 (two) times daily as needed (irritation). 60 g 5  . diclofenac sodium (VOLTAREN) 1 % GEL Apply 1 application topically 2 (two) times  daily as needed (shoulder pain).   0  . fluticasone (FLONASE) 50 MCG/ACT nasal spray Place 2 sprays into both nostrils daily as needed for allergies. 48 g 3  . gabapentin (NEURONTIN) 100 MG capsule Take 1 capsule (100 mg total) by mouth 3 (three) times daily. 270 capsule 3  . hydrochlorothiazide (HYDRODIURIL) 25 MG tablet Take 1 tablet (25 mg total) by mouth daily. 90 tablet 3  . loratadine (EQ ALLERGY RELIEF) 10 MG tablet Take 1 tablet (10 mg total) by mouth daily. (Patient taking differently: Take 10 mg by mouth daily as needed for allergies. ) 90 tablet 1  . losartan (COZAAR) 50 MG tablet Take 1 tablet (50 mg total) by mouth daily. 90 tablet 3  . Multiple Vitamins-Minerals (CENTURY-VITE PO) Take 1 tablet by mouth daily.      Facility-Administered Medications Prior to Visit  Medication Dose Route Frequency Provider Last Rate Last Admin  . nitroGLYCERIN (NITROSTAT) SL tablet 0.4 mg  0.4 mg Sublingual Once Dettinger, Elige Radon, MD        No Known Allergies  Review of Systems  Constitutional: Negative.  Negative for activity change, fatigue and fever.  HENT: Negative.   Eyes: Negative.   Respiratory: Negative.  Negative for cough.   Cardiovascular: Negative.  Negative for chest pain.  Gastrointestinal: Negative.  Negative for abdominal pain.  Endocrine: Negative.   Genitourinary: Negative.  Negative for dysuria.  Musculoskeletal: Positive for back pain, gait problem and myalgias.  Skin: Positive for color change and rash.       Objective:    Physical Exam Constitutional:      General: She is not in acute distress.    Appearance: Normal appearance. She is well-developed.  HENT:     Head: Normocephalic and atraumatic.  Cardiovascular:     Rate and Rhythm: Normal rate.  Pulmonary:     Effort: Pulmonary effort is normal.  Musculoskeletal:     Lumbar back: Spasms and tenderness present. Decreased range of motion.       Back:  Skin:    General: Skin is warm and dry.      Findings: Erythema and rash present. Rash is scaling.     Comments: Circular patches on the lower legs, pink in color, scaling  Neurological:     Mental Status: She is alert and oriented to person, place, and time.     Deep Tendon Reflexes: Reflexes are normal and symmetric.     BP (!) 142/84   Pulse 83   Temp 98.7 F (37.1 C) (Temporal)  Ht 5\' 2"  (1.575 m)   Wt 245 lb (111.1 kg)   BMI 44.81 kg/m  Wt Readings from Last 3 Encounters:  11/06/19 245 lb (111.1 kg)  06/03/19 241 lb 9.6 oz (109.6 kg)  04/09/19 240 lb (108.9 kg)    Health Maintenance Due  Topic Date Due  . HIV Screening  08/04/1981    There are no preventive care reminders to display for this patient.   Lab Results  Component Value Date   TSH 1.180 11/25/2018   Lab Results  Component Value Date   WBC 10.2 04/03/2019   HGB 10.9 (L) 04/03/2019   HCT 37.2 04/03/2019   MCV 75.6 (L) 04/03/2019   PLT 371 04/03/2019   Lab Results  Component Value Date   NA 138 04/03/2019   K 3.3 (L) 04/03/2019   CO2 24 04/03/2019   GLUCOSE 186 (H) 04/03/2019   BUN 12 04/03/2019   CREATININE 0.72 04/03/2019   BILITOT 0.5 11/25/2018   ALKPHOS 78 11/25/2018   AST 14 11/25/2018   ALT 15 11/25/2018   PROT 7.5 11/25/2018   ALBUMIN 4.4 11/25/2018   CALCIUM 8.9 04/03/2019   ANIONGAP 13 04/03/2019   Lab Results  Component Value Date   CHOL 183 11/25/2018   Lab Results  Component Value Date   HDL 57 11/25/2018   Lab Results  Component Value Date   LDLCALC 112 (H) 11/25/2018   Lab Results  Component Value Date   TRIG 68 11/25/2018   Lab Results  Component Value Date   CHOLHDL 3.2 11/25/2018   No results found for: HGBA1C     Assessment & Plan:   Problem List Items Addressed This Visit    None    Visit Diagnoses    Lumbar back pain    -  Primary   Relevant Medications   meloxicam (MOBIC) 7.5 MG tablet   Other Relevant Orders   DG Lumbar Spine 2-3 Views (Completed)       Meds ordered this  encounter  Medications  . meloxicam (MOBIC) 7.5 MG tablet    Sig: Take 1 tablet (7.5 mg total) by mouth daily.    Dispense:  30 tablet    Refill:  0    Order Specific Question:   Supervising Provider    Answer:   01/24/2019 Raliegh Ip  . clobetasol cream (TEMOVATE) 0.05 %    Sig: Apply 1 application topically 2 (two) times daily.    Dispense:  60 g    Refill:  2    Order Specific Question:   Supervising Provider    Answer:   [[id number]] Raliegh Ip     [[id number]], PA-C

## 2019-11-30 ENCOUNTER — Encounter: Payer: Self-pay | Admitting: Physician Assistant

## 2019-11-30 ENCOUNTER — Other Ambulatory Visit: Payer: Self-pay

## 2019-11-30 ENCOUNTER — Ambulatory Visit (INDEPENDENT_AMBULATORY_CARE_PROVIDER_SITE_OTHER): Payer: BC Managed Care – PPO | Admitting: Physician Assistant

## 2019-11-30 VITALS — BP 149/82 | HR 82 | Temp 97.9°F | Ht 62.0 in | Wt 246.2 lb

## 2019-11-30 DIAGNOSIS — Z01419 Encounter for gynecological examination (general) (routine) without abnormal findings: Secondary | ICD-10-CM

## 2019-11-30 DIAGNOSIS — I10 Essential (primary) hypertension: Secondary | ICD-10-CM | POA: Diagnosis not present

## 2019-11-30 DIAGNOSIS — M545 Low back pain, unspecified: Secondary | ICD-10-CM

## 2019-11-30 DIAGNOSIS — Z0000 Encounter for general adult medical examination without abnormal findings: Secondary | ICD-10-CM

## 2019-11-30 DIAGNOSIS — Z975 Presence of (intrauterine) contraceptive device: Secondary | ICD-10-CM

## 2019-11-30 DIAGNOSIS — R232 Flushing: Secondary | ICD-10-CM

## 2019-11-30 MED ORDER — GABAPENTIN 100 MG PO CAPS: 100.0000 mg | ORAL_CAPSULE | Freq: Three times a day (TID) | ORAL | 3 refills | Status: AC

## 2019-11-30 MED ORDER — MELOXICAM 7.5 MG PO TABS: 7.5000 mg | ORAL_TABLET | Freq: Every day | ORAL | 3 refills | Status: DC

## 2019-11-30 MED ORDER — HYDROCORTISONE 2.5 % EX CREA: TOPICAL_CREAM | Freq: Two times a day (BID) | CUTANEOUS | 5 refills | Status: AC

## 2019-11-30 NOTE — Patient Instructions (Signed)
Menopause Menopause is the normal time of life when menstrual periods stop completely. It is usually confirmed by 12 months without a menstrual period. The transition to menopause (perimenopause) most often happens between the ages of 45 and 55. During perimenopause, hormone levels change in your body, which can cause symptoms and affect your health. Menopause may increase your risk for:  Loss of bone (osteoporosis), which causes bone breaks (fractures).  Depression.  Hardening and narrowing of the arteries (atherosclerosis), which can cause heart attacks and strokes. What are the causes? This condition is usually caused by a natural change in hormone levels that happens as you get older. The condition may also be caused by surgery to remove both ovaries (bilateral oophorectomy). What increases the risk? This condition is more likely to start at an earlier age if you have certain medical conditions or treatments, including:  A tumor of the pituitary gland in the brain.  A disease that affects the ovaries and hormone production.  Radiation treatment for cancer.  Certain cancer treatments, such as chemotherapy or hormone (anti-estrogen) therapy.  Heavy smoking and excessive alcohol use.  Family history of early menopause. This condition is also more likely to develop earlier in women who are very thin. What are the signs or symptoms? Symptoms of this condition include:  Hot flashes.  Irregular menstrual periods.  Night sweats.  Changes in feelings about sex. This could be a decrease in sex drive or an increased comfort around your sexuality.  Vaginal dryness and thinning of the vaginal walls. This may cause painful intercourse.  Dryness of the skin and development of wrinkles.  Headaches.  Problems sleeping (insomnia).  Mood swings or irritability.  Memory problems.  Weight gain.  Hair growth on the face and chest.  Bladder infections or problems with urinating. How  is this diagnosed? This condition is diagnosed based on your medical history, a physical exam, your age, your menstrual history, and your symptoms. Hormone tests may also be done. How is this treated? In some cases, no treatment is needed. You and your health care provider should make a decision together about whether treatment is necessary. Treatment will be based on your individual condition and preferences. Treatment for this condition focuses on managing symptoms. Treatment may include:  Menopausal hormone therapy (MHT).  Medicines to treat specific symptoms or complications.  Acupuncture.  Vitamin or herbal supplements. Before starting treatment, make sure to let your health care provider know if you have a personal or family history of:  Heart disease.  Breast cancer.  Blood clots.  Diabetes.  Osteoporosis. Follow these instructions at home: Lifestyle  Do not use any products that contain nicotine or tobacco, such as cigarettes and e-cigarettes. If you need help quitting, ask your health care provider.  Get at least 30 minutes of physical activity on 5 or more days each week.  Avoid alcoholic and caffeinated beverages, as well as spicy foods. This may help prevent hot flashes.  Get 7-8 hours of sleep each night.  If you have hot flashes, try: ? Dressing in layers. ? Avoiding things that may trigger hot flashes, such as spicy food, warm places, or stress. ? Taking slow, deep breaths when a hot flash starts. ? Keeping a fan in your home and office.  Find ways to manage stress, such as deep breathing, meditation, or journaling.  Consider going to group therapy with other women who are having menopause symptoms. Ask your health care provider about recommended group therapy meetings. Eating and   drinking  Eat a healthy, balanced diet that contains whole grains, lean protein, low-fat dairy, and plenty of fruits and vegetables.  Your health care provider may recommend  adding more soy to your diet. Foods that contain soy include tofu, tempeh, and soy milk.  Eat plenty of foods that contain calcium and vitamin D for bone health. Items that are rich in calcium include low-fat milk, yogurt, beans, almonds, sardines, broccoli, and kale. Medicines  Take over-the-counter and prescription medicines only as told by your health care provider.  Talk with your health care provider before starting any herbal supplements. If prescribed, take vitamins and supplements as told by your health care provider. These may include: ? Calcium. Women age 51 and older should get 1,200 mg (milligrams) of calcium every day. ? Vitamin D. Women need 600-800 International Units of vitamin D each day. ? Vitamins B12 and B6. Aim for 50 micrograms of B12 and 1.5 mg of B6 each day. General instructions  Keep track of your menstrual periods, including: ? When they occur. ? How heavy they are and how long they last. ? How much time passes between periods.  Keep track of your symptoms, noting when they start, how often you have them, and how long they last.  Use vaginal lubricants or moisturizers to help with vaginal dryness and improve comfort during sex.  Keep all follow-up visits as told by your health care provider. This is important. This includes any group therapy or counseling. Contact a health care provider if:  You are still having menstrual periods after age 55.  You have pain during sex.  You have not had a period for 12 months and you develop vaginal bleeding. Get help right away if:  You have: ? Severe depression. ? Excessive vaginal bleeding. ? Pain when you urinate. ? A fast or irregular heart beat (palpitations). ? Severe headaches. ? Abdomen (abdominal) pain or severe indigestion.  You fell and you think you have a broken bone.  You develop leg or chest pain.  You develop vision problems.  You feel a lump in your breast. Summary  Menopause is the normal  time of life when menstrual periods stop completely. It is usually confirmed by 12 months without a menstrual period.  The transition to menopause (perimenopause) most often happens between the ages of 45 and 55.  Symptoms can be managed through medicines, lifestyle changes, and complementary therapies such as acupuncture.  Eat a balanced diet that is rich in nutrients to promote bone health and heart health and to manage symptoms during menopause. This information is not intended to replace advice given to you by your health care provider. Make sure you discuss any questions you have with your health care provider. Document Revised: 09/13/2017 Document Reviewed: 11/03/2016 Elsevier Patient Education  2020 [employer] Inc[employer]  

## 2019-11-30 NOTE — Progress Notes (Signed)
Subjective:    Patient ID: Rachel Pearson, female    DOB: 12-16-1965, 54 y.o.   MRN: [id number]  Chief Complaint  Patient presents with  . Annual Exam    HPI Patient is in today for her annual well exam and female exam.  We will have labs performed.  She states that overall she is doing very well.  We will perform Pap and pelvic today.  She had an IUD placed a few years ago and would like to have it removed.  She would like a referral to a different gynecologist.  We will set that referral with Dr. Adah Perl.  Otherwise she is doing pretty well with her different back pain, degenerative disc disease, eczema.  She does need refills on some of her standard medicine.  mammo 09/24/2019 Pap pelvic 11/30/2019 Colonoscopy 04/09/2019 Past Medical History:  Diagnosis Date  . Allergy   . Anemia   . Hypertension     Past Surgical History:  Procedure Laterality Date  . COLONOSCOPY WITH PROPOFOL N/A 04/09/2019   Procedure: COLONOSCOPY WITH PROPOFOL;  Surgeon: Daneil Dolin, MD;  Location: [facility];  Service: Endoscopy;  Laterality: N/A;  7:30am  . EYE SURGERY Bilateral    x 2 (bilateral)  . POLYPECTOMY  04/09/2019   Procedure: POLYPECTOMY;  Surgeon: Daneil Dolin, MD;  Location: [facility];  Service: Endoscopy;;  rectal   . ROTATOR CUFF REPAIR Left    removal of bone spurs  . TUBAL LIGATION      Family History  Problem Relation Age of Onset  . Leukemia Mother   . Diabetes Mother   . Hypertension Mother   . Diabetes Father   . Hypertension Father   . Thyroid disease Sister   . Hypertension Brother   . Hypertension Brother   . Colon cancer Neg Hx     Social History   Socioeconomic History  . Marital status: Married    Spouse name: Not on file  . Number of children: Not on file  . Years of education: Not on file  . Highest education level: Not on file  Occupational History  . Not on file  Tobacco Use  . Smoking status: Never Smoker  . Smokeless  tobacco: Never Used  Substance and Sexual Activity  . Alcohol use: No  . Drug use: No  . Sexual activity: Yes  Other Topics Concern  . Not on file  Social History Narrative  . Not on file   Social Determinants of Health   Financial Resource Strain:   . Difficulty of Paying Living Expenses: Not on file  Food Insecurity:   . Worried About Charity fundraiser in the Last Year: Not on file  . Ran Out of Food in the Last Year: Not on file  Transportation Needs:   . Lack of Transportation (Medical): Not on file  . Lack of Transportation (Non-Medical): Not on file  Physical Activity:   . Days of Exercise per Week: Not on file  . Minutes of Exercise per Session: Not on file  Stress:   . Feeling of Stress : Not on file  Social Connections:   . Frequency of Communication with Friends and Family: Not on file  . Frequency of Social Gatherings with Friends and Family: Not on file  . Attends Religious Services: Not on file  . Active Member of Clubs or Organizations: Not on file  . Attends Archivist Meetings: Not  on file  . Marital Status: Not on file  Intimate Partner Violence:   . Fear of Current or Ex-Partner: Not on file  . Emotionally Abused: Not on file  . Physically Abused: Not on file  . Sexually Abused: Not on file    Outpatient Medications Prior to Visit  Medication Sig Dispense Refill  . Cholecalciferol (VITAMIN D-3) 5000 UNITS TABS Take 5,000 Units by mouth daily.     . clotrimazole (LOTRIMIN) 1 % cream Apply 1 application topically 2 (two) times daily as needed (irritation). 60 g 5  . diclofenac sodium (VOLTAREN) 1 % GEL Apply 1 application topically 2 (two) times daily as needed (shoulder pain).   0  . fluticasone (FLONASE) 50 MCG/ACT nasal spray Place 2 sprays into both nostrils daily as needed for allergies. 48 g 3  . hydrochlorothiazide (HYDRODIURIL) 25 MG tablet Take 1 tablet (25 mg total) by mouth daily. 90 tablet 3  . loratadine (EQ ALLERGY RELIEF) 10 MG  tablet Take 1 tablet (10 mg total) by mouth daily. (Patient taking differently: Take 10 mg by mouth daily as needed for allergies. ) 90 tablet 1  . losartan (COZAAR) 50 MG tablet Take 1 tablet (50 mg total) by mouth daily. 90 tablet 3  . Multiple Vitamins-Minerals (CENTURY-VITE PO) Take 1 tablet by mouth daily.     . clobetasol cream (TEMOVATE) 4.16 % Apply 1 application topically 2 (two) times daily. 60 g 2  . gabapentin (NEURONTIN) 100 MG capsule Take 1 capsule (100 mg total) by mouth 3 (three) times daily. 270 capsule 3  . meloxicam (MOBIC) 7.5 MG tablet Take 1 tablet (7.5 mg total) by mouth daily. 30 tablet 0   Facility-Administered Medications Prior to Visit  Medication Dose Route Frequency Provider Last Rate Last Admin  . nitroGLYCERIN (NITROSTAT) SL tablet 0.4 mg  0.4 mg Sublingual Once Dettinger, Fransisca Kaufmann, MD        No Known Allergies  Review of Systems  Constitutional: Negative.  Negative for activity change, fatigue and fever.  HENT: Negative.   Eyes: Negative.   Respiratory: Negative.  Negative for cough.   Cardiovascular: Negative.  Negative for chest pain.  Gastrointestinal: Negative.  Negative for abdominal pain.  Endocrine: Negative.   Genitourinary: Negative.  Negative for dysuria.  Musculoskeletal: Negative.   Skin: Negative.   Neurological: Negative.        Objective:    Physical Exam Constitutional:      Appearance: She is well-developed.  HENT:     Head: Normocephalic and atraumatic.  Eyes:     Conjunctiva/sclera: Conjunctivae normal.     Pupils: Pupils are equal, round, and reactive to light.  Cardiovascular:     Rate and Rhythm: Normal rate and regular rhythm.     Heart sounds: Normal heart sounds.  Pulmonary:     Effort: Pulmonary effort is normal.     Breath sounds: Normal breath sounds.  Chest:     Breasts: Breasts are symmetrical.        Right: No mass, skin change or tenderness.        Left: No mass, skin change or tenderness.  Abdominal:      General: Bowel sounds are normal.     Palpations: Abdomen is soft.     Hernia: There is no hernia in the left inguinal area or right inguinal area.  Genitourinary:    General: Normal vulva.     Exam position: Supine.     Labia:  Right: No tenderness or lesion.        Left: No tenderness or lesion.      Vagina: Normal. No vaginal discharge, tenderness or bleeding.     Cervix: No cervical motion tenderness, discharge or friability.     Uterus: Not deviated, not enlarged and not tender.      Adnexa:        Right: No mass, tenderness or fullness.         Left: No mass, tenderness or fullness.       Rectum: No anal fissure.  Musculoskeletal:     Cervical back: Normal range of motion and neck supple.  Skin:    General: Skin is warm and dry.     Findings: No rash.  Neurological:     Mental Status: She is alert and oriented to person, place, and time.     Deep Tendon Reflexes: Reflexes are normal and symmetric.  Psychiatric:        Behavior: Behavior normal.        Thought Content: Thought content normal.        Judgment: Judgment normal.     BP (!) 149/82   Pulse 82   Temp 97.9 F (36.6 C) (Temporal)   Ht '5\' 2"'  (1.575 m)   Wt 246 lb 3.2 oz (111.7 kg)   SpO2 97%   BMI 45.03 kg/m  Wt Readings from Last 3 Encounters:  11/30/19 246 lb 3.2 oz (111.7 kg)  11/06/19 245 lb (111.1 kg)  06/03/19 241 lb 9.6 oz (109.6 kg)    Health Maintenance Due  Topic Date Due  . HIV Screening  08/04/1981    There are no preventive care reminders to display for this patient.   Lab Results  Component Value Date   TSH 1.800 11/30/2019   Lab Results  Component Value Date   WBC 13.7 (H) 11/30/2019   HGB 11.6 11/30/2019   HCT 37.2 11/30/2019   MCV 72 (L) 11/30/2019   PLT 357 11/30/2019   Lab Results  Component Value Date   NA 141 11/30/2019   K 4.2 11/30/2019   CO2 23 11/30/2019   GLUCOSE 98 11/30/2019   BUN 18 11/30/2019   CREATININE 0.82 11/30/2019   BILITOT 0.3  11/30/2019   ALKPHOS 83 11/30/2019   AST 15 11/30/2019   ALT 12 11/30/2019   PROT 7.6 11/30/2019   ALBUMIN 4.1 11/30/2019   CALCIUM 9.5 11/30/2019   ANIONGAP 13 04/03/2019   Lab Results  Component Value Date   CHOL 181 11/30/2019   Lab Results  Component Value Date   HDL 57 11/30/2019   Lab Results  Component Value Date   LDLCALC 108 (H) 11/30/2019   Lab Results  Component Value Date   TRIG 88 11/30/2019   Lab Results  Component Value Date   CHOLHDL 3.2 11/30/2019   No results found for: HGBA1C     Assessment & Plan:   Problem List Items Addressed This Visit      Other   Lumbar back pain   Relevant Medications   meloxicam (MOBIC) 7.5 MG tablet    Other Visit Diagnoses    IUD (intrauterine device) in place    -  Primary   Relevant Orders   Ambulatory referral to Obstetrics / Gynecology   Hot flashes       Relevant Medications   gabapentin (NEURONTIN) 100 MG capsule   Well female exam with routine gynecological exam  Relevant Orders   CBC with Differential/Platelet (Completed)   CMP14+EGFR (Completed)   Lipid panel (Completed)   TSH (Completed)   IGP, Aptima HPV, rfx 16/18,45   Well adult exam       Relevant Medications   gabapentin (NEURONTIN) 100 MG capsule       Meds ordered this encounter  Medications  . meloxicam (MOBIC) 7.5 MG tablet    Sig: Take 1 tablet (7.5 mg total) by mouth daily.    Dispense:  90 tablet    Refill:  3    Order Specific Question:   Supervising Provider    Answer:   Janora Norlander [[id number]]  . gabapentin (NEURONTIN) 100 MG capsule    Sig: Take 1 capsule (100 mg total) by mouth 3 (three) times daily.    Dispense:  270 capsule    Refill:  3    Order Specific Question:   Supervising Provider    Answer:   Janora Norlander [[id number]]  . hydrocortisone 2.5 % cream    Sig: Apply topically 2 (two) times daily.    Dispense:  30 g    Refill:  5    Order Specific Question:   Supervising Provider    Answer:    Janora Norlander [[id number]]     Terald Sleeper, PA-C   Terald Sleeper PA-C [facility] [address]  [place], [place] [address] [phone]

## 2019-12-01 LAB — CBC WITH DIFFERENTIAL/PLATELET
Basophils Absolute: 0.1 10*3/uL (ref 0.0–0.2)
Basos: 1 %
EOS (ABSOLUTE): 0.4 10*3/uL (ref 0.0–0.4)
Eos: 3 %
Hematocrit: 37.2 % (ref 34.0–46.6)
Hemoglobin: 11.6 g/dL (ref 11.1–15.9)
Immature Grans (Abs): 0 10*3/uL (ref 0.0–0.1)
Immature Granulocytes: 0 %
Lymphocytes Absolute: 3.8 10*3/uL — ABNORMAL HIGH (ref 0.7–3.1)
Lymphs: 28 %
MCH: 22.4 pg — ABNORMAL LOW (ref 26.6–33.0)
MCHC: 31.2 g/dL — ABNORMAL LOW (ref 31.5–35.7)
MCV: 72 fL — ABNORMAL LOW (ref 79–97)
Monocytes Absolute: 1.4 10*3/uL — ABNORMAL HIGH (ref 0.1–0.9)
Monocytes: 10 %
Neutrophils Absolute: 7.9 10*3/uL — ABNORMAL HIGH (ref 1.4–7.0)
Neutrophils: 58 %
Platelets: 357 10*3/uL (ref 150–450)
RBC: 5.18 x10E6/uL (ref 3.77–5.28)
RDW: 13.8 % (ref 11.7–15.4)
WBC: 13.7 10*3/uL — ABNORMAL HIGH (ref 3.4–10.8)

## 2019-12-01 LAB — CMP14+EGFR
ALT: 12 IU/L (ref 0–32)
AST: 15 IU/L (ref 0–40)
Albumin/Globulin Ratio: 1.2 (ref 1.2–2.2)
Albumin: 4.1 g/dL (ref 3.8–4.9)
Alkaline Phosphatase: 83 IU/L (ref 39–117)
BUN/Creatinine Ratio: 22 (ref 9–23)
BUN: 18 mg/dL (ref 6–24)
Bilirubin Total: 0.3 mg/dL (ref 0.0–1.2)
CO2: 23 mmol/L (ref 20–29)
Calcium: 9.5 mg/dL (ref 8.7–10.2)
Chloride: 103 mmol/L (ref 96–106)
Creatinine, Ser: 0.82 mg/dL (ref 0.57–1.00)
GFR calc Af Amer: 94 mL/min/{1.73_m2} (ref >59)
GFR calc non Af Amer: 82 mL/min/{1.73_m2} (ref >59)
Globulin, Total: 3.5 g/dL (ref 1.5–4.5)
Glucose: 98 mg/dL (ref 65–99)
Potassium: 4.2 mmol/L (ref 3.5–5.2)
Sodium: 141 mmol/L (ref 134–144)
Total Protein: 7.6 g/dL (ref 6.0–8.5)

## 2019-12-01 LAB — LIPID PANEL
Chol/HDL Ratio: 3.2 ratio (ref 0.0–4.4)
Cholesterol, Total: 181 mg/dL (ref 100–199)
HDL: 57 mg/dL (ref >39)
LDL Chol Calc (NIH): 108 mg/dL — ABNORMAL HIGH (ref 0–99)
Triglycerides: 88 mg/dL (ref 0–149)
VLDL Cholesterol Cal: 16 mg/dL (ref 5–40)

## 2019-12-01 LAB — TSH: TSH: 1.8 u[IU]/mL (ref 0.450–4.500)

## 2019-12-03 LAB — IGP, APTIMA HPV, RFX 16/18,45: HPV Aptima: NEGATIVE

## 2019-12-23 DIAGNOSIS — Z30432 Encounter for removal of intrauterine contraceptive device: Secondary | ICD-10-CM | POA: Diagnosis not present

## 2019-12-24 ENCOUNTER — Ambulatory Visit: Payer: Self-pay

## 2019-12-24 ENCOUNTER — Ambulatory Visit: Payer: BC Managed Care – PPO | Attending: Internal Medicine

## 2019-12-24 DIAGNOSIS — Z23 Encounter for immunization: Secondary | ICD-10-CM

## 2019-12-24 NOTE — Progress Notes (Signed)
   Covid-19 Vaccination Clinic  Name:  Rachel Pearson    MRN: [id number] DOB: 07/20/1966  12/24/2019  [name] was observed post Covid-19 immunization for 15 minutes without incident. She was provided with Vaccine Information Sheet and instruction to access the V-Safe system.   [name] was instructed to call 911 with any severe reactions post vaccine: Marland Kitchen Difficulty breathing  . Swelling of face and throat  . A fast heartbeat  . A bad rash all over body  . Dizziness and weakness   Immunizations Administered    Name Date Dose VIS Date Route   Moderna COVID-19 Vaccine 12/24/2019  8:49 AM 0.5 mL 09/15/2019 Intramuscular   Manufacturer: Moderna   Lot: [id number]   NDC: 64290-379-55

## 2020-01-26 ENCOUNTER — Ambulatory Visit: Payer: BC Managed Care – PPO | Attending: Internal Medicine

## 2020-01-26 DIAGNOSIS — Z23 Encounter for immunization: Secondary | ICD-10-CM

## 2020-01-26 NOTE — Progress Notes (Signed)
   Covid-19 Vaccination Clinic  Name:  Rachel Pearson    MRN: [id number] DOB: [birth year]  01/26/2020  [name] was observed post Covid-19 immunization for 15 minutes without incident. She was provided with Vaccine Information Sheet and instruction to access the V-Safe system.   [name] was instructed to call 911 with any severe reactions post vaccine: Marland Kitchen Difficulty breathing  . Swelling of face and throat  . A fast heartbeat  . A bad rash all over body  . Dizziness and weakness   Immunizations Administered    Name Date Dose VIS Date Route   Moderna COVID-19 Vaccine 01/26/2020  8:41 AM 0.5 mL 09/15/2019 Intramuscular   Manufacturer: Moderna   Lot: [id number]   NDC: 82883-374-45

## 2020-04-30 IMAGING — DX DG LUMBAR SPINE 2-3V
2 series · 2 of 2 positions shown · non-contrast
Comparison: None.

CLINICAL DATA: Low back pain for 1 month, no known injury, initial
encounter

EXAM:
LUMBAR SPINE - 2 VIEW

[l-spine lat]
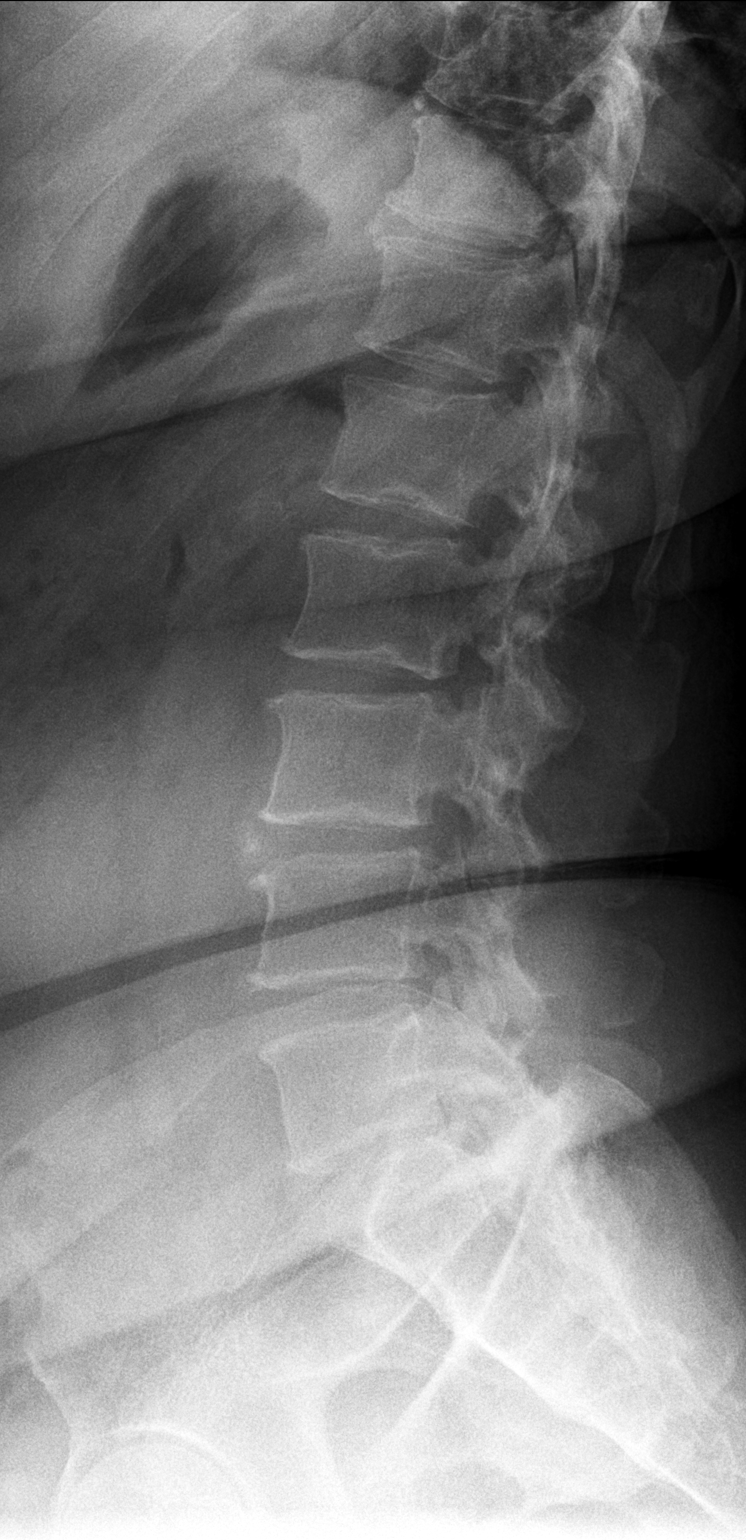

[l-spine ap]
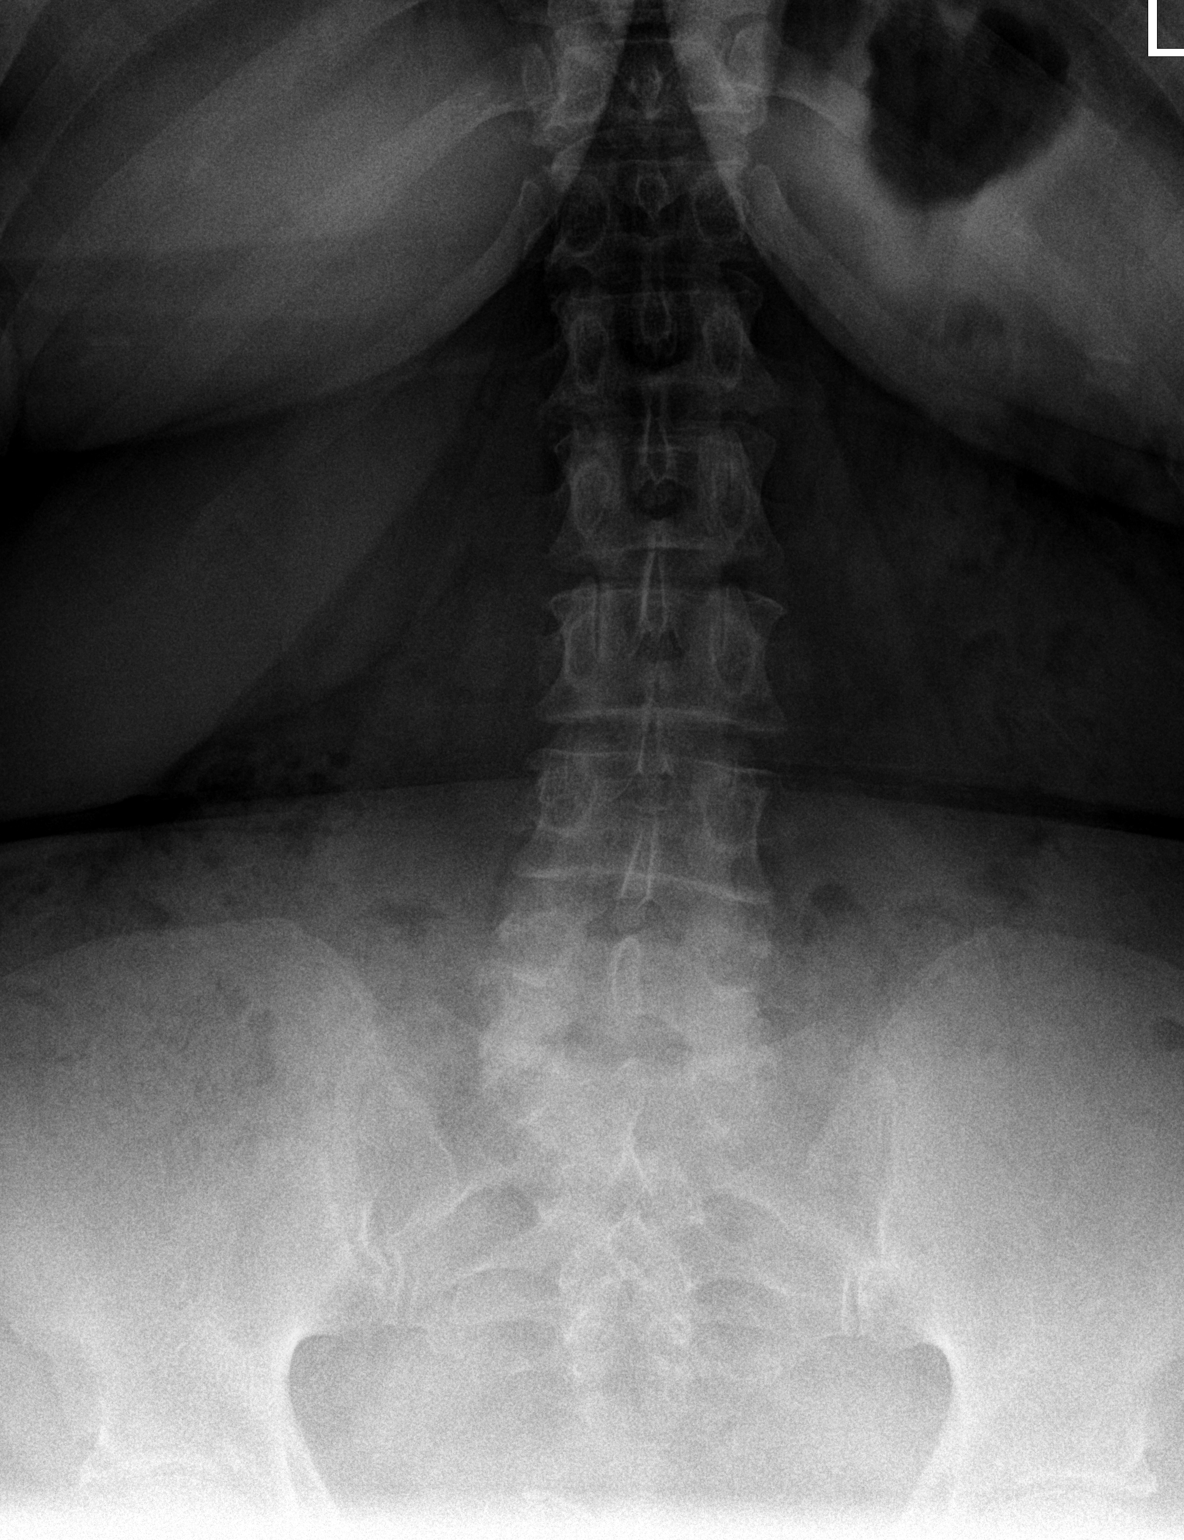

[2 of 2 positions shown; findings below may reference images not displayed]

FINDINGS: Five lumbar type vertebral bodies are well visualized. Vertebral
body height is well maintained. Mild osteophytic changes are seen.
Disc space narrowing at L3-4 is noted. No soft tissue abnormality is
seen.
IMPRESSION: Mild degenerative change without acute abnormality.

## 2020-05-24 ENCOUNTER — Encounter: Payer: Self-pay | Admitting: Nurse Practitioner

## 2020-05-24 ENCOUNTER — Other Ambulatory Visit: Payer: Self-pay

## 2020-05-24 ENCOUNTER — Ambulatory Visit (INDEPENDENT_AMBULATORY_CARE_PROVIDER_SITE_OTHER): Payer: BC Managed Care – PPO | Admitting: Nurse Practitioner

## 2020-05-24 VITALS — BP 141/75 | HR 84 | Temp 97.7°F | Resp 20 | Ht 62.0 in | Wt 258.0 lb

## 2020-05-24 DIAGNOSIS — L309 Dermatitis, unspecified: Secondary | ICD-10-CM | POA: Insufficient documentation

## 2020-05-24 DIAGNOSIS — L03116 Cellulitis of left lower limb: Secondary | ICD-10-CM | POA: Insufficient documentation

## 2020-05-24 MED ORDER — TRIAMCINOLONE ACETONIDE 0.1 % EX CREA: 1.0000 "application " | TOPICAL_CREAM | Freq: Two times a day (BID) | CUTANEOUS | 2 refills | Status: AC

## 2020-05-24 MED ORDER — AMOXICILLIN-POT CLAVULANATE 875-125 MG PO TABS: 1.0000 | ORAL_TABLET | Freq: Two times a day (BID) | ORAL | 0 refills | Status: DC

## 2020-05-24 NOTE — Progress Notes (Signed)
Acute Office Visit  Subjective:    Patient ID: Rachel Pearson, female    DOB: May 26, 1966, 54 y.o.   MRN: [id number]  Chief Complaint  Patient presents with  . Rash    itch/ dry patches - noticed about 1 mo ago   . Edema    slightly red legs and swelling     Rash This is a new problem. The current episode started 1 to 4 weeks ago. The problem has been gradually worsening since onset. The affected locations include the left upper leg, left lower leg, right fingers and left fingers. The rash is characterized by itchiness, swelling, dryness, peeling, bruising and redness. Pertinent negatives include no fever, joint pain or shortness of breath. Past treatments include nothing.    Cellulitis Concerning patient's cellulitis, this is new for patient in the last 3 to 4 weeks.  Patient reports mild rash that started spreading around her legs and upper thigh.  Patient reports legs are warm to touch, edematous but she has not had any fever, nausea or vomiting.  Patient reports skin is extremely dry and slightly itchy.  Nothing has been used to relieve symptoms.     Dermatitis  Is reporting new dermatitis occurred in the last 2 to 4 weeks.  This is new for patient and is gradually worsening.  Skin around fingernail bed is red, itchy, scaly, peeling with little blisters.  Patient unable to say what started symptoms.  Patient has not changed detergents, had any new medication or diet.  Past Medical History:  Diagnosis Date  . Allergy   . Anemia   . Hypertension     Past Surgical History:  Procedure Laterality Date  . COLONOSCOPY WITH PROPOFOL N/A 04/09/2019   Procedure: COLONOSCOPY WITH PROPOFOL;  Surgeon: Corbin Ade, MD;  Location: [facility];  Service: Endoscopy;  Laterality: N/A;  7:30am  . EYE SURGERY Bilateral    x 2 (bilateral)  . POLYPECTOMY  04/09/2019   Procedure: POLYPECTOMY;  Surgeon: Corbin Ade, MD;  Location: [facility];  Service: Endoscopy;;  rectal   .  ROTATOR CUFF REPAIR Left    removal of bone spurs  . TUBAL LIGATION      Family History  Problem Relation Age of Onset  . Leukemia Mother   . Diabetes Mother   . Hypertension Mother   . Diabetes Father   . Hypertension Father   . Thyroid disease Sister   . Hypertension Brother   . Hypertension Brother   . Colon cancer Neg Hx     Social History   Socioeconomic History  . Marital status: Married    Spouse name: Not on file  . Number of children: Not on file  . Years of education: Not on file  . Highest education level: Not on file  Occupational History  . Not on file  Tobacco Use  . Smoking status: Never Smoker  . Smokeless tobacco: Never Used  Vaping Use  . Vaping Use: Never used  Substance and Sexual Activity  . Alcohol use: No  . Drug use: No  . Sexual activity: Yes  Other Topics Concern  . Not on file  Social History Narrative  . Not on file   Social Determinants of Health   Financial Resource Strain:   . Difficulty of Paying Living Expenses:   Food Insecurity:   . Worried About Programme researcher, broadcasting/film/video in the Last Year:   . [employer] of Food in the Last  Year:   Transportation Needs:   . Freight forwarder (Medical):   Marland Kitchen Lack of Transportation (Non-Medical):   Physical Activity:   . Days of Exercise per Week:   . Minutes of Exercise per Session:   Stress:   . Feeling of Stress :   Social Connections:   . Frequency of Communication with Friends and Family:   . Frequency of Social Gatherings with Friends and Family:   . Attends Religious Services:   . Active Member of Clubs or Organizations:   . Attends Banker Meetings:   Marland Kitchen Marital Status:   Intimate Partner Violence:   . Fear of Current or Ex-Partner:   . Emotionally Abused:   Marland Kitchen Physically Abused:   . Sexually Abused:     Outpatient Medications Prior to Visit  Medication Sig Dispense Refill  . Cholecalciferol (VITAMIN D-3) 5000 UNITS TABS Take 5,000 Units by mouth daily.     .  clotrimazole (LOTRIMIN) 1 % cream Apply 1 application topically 2 (two) times daily as needed (irritation). 60 g 5  . diclofenac sodium (VOLTAREN) 1 % GEL Apply 1 application topically 2 (two) times daily as needed (shoulder pain).   0  . fluticasone (FLONASE) 50 MCG/ACT nasal spray Place 2 sprays into both nostrils daily as needed for allergies. 48 g 3  . gabapentin (NEURONTIN) 100 MG capsule Take 1 capsule (100 mg total) by mouth 3 (three) times daily. 270 capsule 3  . hydrocortisone 2.5 % cream Apply topically 2 (two) times daily. 30 g 5  . losartan (COZAAR) 50 MG tablet Take 1 tablet (50 mg total) by mouth daily. 90 tablet 3  . meloxicam (MOBIC) 7.5 MG tablet Take 1 tablet (7.5 mg total) by mouth daily. 90 tablet 3  . Multiple Vitamins-Minerals (CENTURY-VITE PO) Take 1 tablet by mouth daily.     . hydrochlorothiazide (HYDRODIURIL) 25 MG tablet Take 1 tablet (25 mg total) by mouth daily. (Patient not taking: Reported on 05/24/2020) 90 tablet 3  . loratadine (EQ ALLERGY RELIEF) 10 MG tablet Take 1 tablet (10 mg total) by mouth daily. 90 tablet 1  . nitroGLYCERIN (NITROSTAT) SL tablet 0.4 mg      No facility-administered medications prior to visit.    No Known Allergies  Review of Systems  Constitutional: Negative for fever.  Respiratory: Negative for shortness of breath.   Cardiovascular: Negative.   Gastrointestinal: Negative.   Genitourinary: Negative.   Musculoskeletal: Negative.  Negative for joint pain.  Skin: Positive for color change and rash.  Neurological: Negative.   Psychiatric/Behavioral: Negative.        Objective:    Physical Exam Constitutional:      Appearance: Normal appearance.  HENT:     Head: Normocephalic.  Eyes:     Conjunctiva/sclera: Conjunctivae normal.  Cardiovascular:     Rate and Rhythm: Normal rate and regular rhythm.     Pulses: Normal pulses.     Heart sounds: Normal heart sounds.  Pulmonary:     Effort: Pulmonary effort is normal.      Breath sounds: Normal breath sounds.  Abdominal:     General: Bowel sounds are normal.  Musculoskeletal:        General: Normal range of motion.  Skin:    General: Skin is warm.     Findings: Erythema and rash present.  Neurological:     Mental Status: She is alert and oriented to person, place, and time.     BP (!) 141/75 (BP  Location: Right Arm, Cuff Size: Large)   Pulse 84   Temp 97.7 F (36.5 C)   Resp 20   Ht 5\' 2"  (1.575 m)   Wt 258 lb (117 kg)   SpO2 98%   BMI 47.19 kg/m  Wt Readings from Last 3 Encounters:  05/24/20 258 lb (117 kg)  11/30/19 246 lb 3.2 oz (111.7 kg)  11/06/19 245 lb (111.1 kg)    Health Maintenance Due  Topic Date Due  . Hepatitis C Screening  Never done  . HIV Screening  Never done  . INFLUENZA VACCINE  05/15/2020    There are no preventive care reminders to display for this patient.   Lab Results  Component Value Date   TSH 1.800 11/30/2019   Lab Results  Component Value Date   WBC 13.7 (H) 11/30/2019   HGB 11.6 11/30/2019   HCT 37.2 11/30/2019   MCV 72 (L) 11/30/2019   PLT 357 11/30/2019   Lab Results  Component Value Date   NA 141 11/30/2019   K 4.2 11/30/2019   CO2 23 11/30/2019   GLUCOSE 98 11/30/2019   BUN 18 11/30/2019   CREATININE 0.82 11/30/2019   BILITOT 0.3 11/30/2019   ALKPHOS 83 11/30/2019   AST 15 11/30/2019   ALT 12 11/30/2019   PROT 7.6 11/30/2019   ALBUMIN 4.1 11/30/2019   CALCIUM 9.5 11/30/2019   ANIONGAP 13 04/03/2019   Lab Results  Component Value Date   CHOL 181 11/30/2019   Lab Results  Component Value Date   HDL 57 11/30/2019   Lab Results  Component Value Date   LDLCALC 108 (H) 11/30/2019   Lab Results  Component Value Date   TRIG 88 11/30/2019   Lab Results  Component Value Date   CHOLHDL 3.2 11/30/2019   No results found for: HGBA1C     Assessment & Plan:  Cellulitis of left lower extremity Patient is a 54 year old female who presents to clinic with cellulitis of her left  lower extremity.  Patient reported that about 3 to 4 weeks ago she developed mild rash that spread around her legs and upper thigh.  Legs are hot to touch warm and edematous.  Patient is not reporting any fever, nausea or vomiting.  Patient is reporting mild pain, itching and extremely dry skin.  Patient has not used anything over-the-counter to relieve symptoms. Provided education to patient with printed handouts given. Started patient on Augmentin twice daily for 10 days. Patient follow-up in 4 weeks.  Or earlier with signs and symptoms of worsening or unresolved symptoms.  Dermatitis Patient is reporting new dermatitis that started 2 to 4 weeks ago.  Dermatitis is not well managed and gradually worsening.  Skin around fingernail bed is red, itchy, scaly, peeling with little blisters.  Patient unable to say what started symptoms.  Patient has not changed detergents, new medication or diet. Provided education to patient with printed handouts given. Started patient on a moderate potency topical corticosteroid. Patient follow-up in 4 weeks or with worsening and unresolved symptoms.  Problem List Items Addressed This Visit    None       No orders of the defined types were placed in this encounter.    40, NP

## 2020-05-24 NOTE — Assessment & Plan Note (Signed)
Patient is a 54 year old female who presents to clinic with cellulitis of her left lower extremity.  Patient reported that about 3 to 4 weeks ago she developed mild rash that spread around her legs and upper thigh.  Legs are hot to touch warm and edematous.  Patient is not reporting any fever, nausea or vomiting.  Patient is reporting mild pain, itching and extremely dry skin.  Patient has not used anything over-the-counter to relieve symptoms. Provided education to patient with printed handouts given. Started patient on Augmentin twice daily for 10 days. Patient follow-up in 4 weeks.  Or earlier with signs and symptoms of worsening or unresolved symptoms.

## 2020-05-24 NOTE — Patient Instructions (Addendum)
Cellulitis of left lower extremity Patient is a 54 year old female who presents to clinic with cellulitis of her left lower extremity.  Patient reported that about 3 to 4 weeks ago she developed mild rash that spread around her legs and upper thigh.  Legs are hot to touch warm and edematous.  Patient is not reporting any fever, nausea or vomiting.  Patient is reporting mild pain, itching and extremely dry skin.  Patient has not used anything over-the-counter to relieve symptoms. Provided education to patient with printed handouts given. Started patient on Augmentin twice daily for 10 days. Patient follow-up in 4 weeks.  Or earlier with signs and symptoms of worsening or unresolved symptoms.  Atopic Dermatitis Atopic dermatitis is a skin disorder that causes inflammation of the skin. This is the most common type of eczema. Eczema is a group of skin conditions that cause the skin to be itchy, red, and swollen. This condition is generally worse during the cooler winter months and often improves during the warm summer months. Symptoms can vary from person to person. Atopic dermatitis usually starts showing signs in infancy and can last through adulthood. This condition cannot be passed from one person to another (non-contagious), but it is more common in families. Atopic dermatitis may not always be present. When it is present, it is called a flare-up. What are the causes? The exact cause of this condition is not known. Flare-ups of the condition may be triggered by:  Contact with something that you are sensitive or allergic to.  Stress.  Certain foods.  Extremely hot or cold weather.  Harsh chemicals and soaps.  Dry air.  Chlorine. What increases the risk? This condition is more likely to develop in people who have a personal history or family history of eczema, allergies, asthma, or hay fever. What are the signs or symptoms? Symptoms of this condition include:  Dry, scaly skin.  Red,  itchy rash.  Itchiness, which can be severe. This may occur before the skin rash. This can make sleeping difficult.  Skin thickening and cracking that can occur over time. How is this diagnosed? This condition is diagnosed based on your symptoms, a medical history, and a physical exam. How is this treated? There is no cure for this condition, but symptoms can usually be controlled. Treatment focuses on:  Controlling the itchiness and scratching. You may be given medicines, such as antihistamines or steroid creams.  Limiting exposure to things that you are sensitive or allergic to (allergens).  Recognizing situations that cause stress and developing a plan to manage stress. If your atopic dermatitis does not get better with medicines, or if it is all over your body (widespread), a treatment using a specific type of light (phototherapy) may be used. Follow these instructions at home: Skin care   Keep your skin well-moisturized. Doing this seals in moisture and helps to prevent dryness. ? Use unscented lotions that have petroleum in them. ? Avoid lotions that contain alcohol or water. They can dry the skin.  Keep baths or showers short (less than 5 minutes) in warm water. Do not use hot water. ? Use mild, unscented cleansers for bathing. Avoid soap and bubble bath. ? Apply a moisturizer to your skin right after a bath or shower.  Do not apply anything to your skin without checking with your health care provider. General instructions  Dress in clothes made of cotton or cotton blends. Dress lightly because heat increases itchiness.  When washing your clothes, rinse your clothes  twice so all of the soap is removed.  Avoid any triggers that can cause a flare-up.  Try to manage your stress.  Keep your fingernails cut short.  Avoid scratching. Scratching makes the rash and itchiness worse. It may also result in a skin infection (impetigo) due to a break in the skin caused by  scratching.  Take or apply over-the-counter and prescription medicines only as told by your health care provider.  Keep all follow-up visits as told by your health care provider. This is important.  Do not be around people who have cold sores or fever blisters. If you get the infection, it may cause your atopic dermatitis to worsen. Contact a health care provider if:  Your itchiness interferes with sleep.  Your rash gets worse or it is not better within one week of starting treatment.  You have a fever.  You have a rash flare-up after having contact with someone who has cold sores or fever blisters. Get help right away if:  You develop pus or soft yellow scabs in the rash area. Summary  This condition causes a red rash and itchy, dry, scaly skin.  Treatment focuses on controlling the itchiness and scratching, limiting exposure to things that you are sensitive or allergic to (allergens), recognizing situations that cause stress, and developing a plan to manage stress.  Keep your skin well-moisturized.  Keep baths or showers shorter than 5 minutes and use warm water. Do not use hot water. This information is not intended to replace advice given to you by your health care provider. Make sure you discuss any questions you have with your health care provider. Document Revised: 01/20/2019 Document Reviewed: 11/02/2016 Elsevier Patient Education  2020 [employer] Cellulitis, Adult  Cellulitis is a skin infection. The infected area is often warm, red, swollen, and sore. It occurs most often in the arms and lower legs. It is very important to get treated for this condition. What are the causes? This condition is caused by bacteria. The bacteria enter through a break in the skin, such as a cut, burn, insect bite, open sore, or crack. What increases the risk? This condition is more likely to occur in people who:  Have a weak body defense system (immune system).  Have open cuts,  burns, bites, or scrapes on the skin.  Are older than 54 years of age.  Have a blood sugar problem (diabetes).  Have a long-lasting (chronic) liver disease (cirrhosis) or kidney disease.  Are very overweight (obese).  Have a skin problem, such as: ? Itchy rash (eczema). ? Slow movement of blood in the veins (venous stasis). ? Fluid buildup below the skin (edema).  Have been treated with high-energy rays (radiation).  Use IV drugs. What are the signs or symptoms? Symptoms of this condition include:  Skin that is: ? Red. ? Streaking. ? Spotting. ? Swollen. ? Sore or painful when you touch it. ? Warm.  A fever.  Chills.  Blisters. How is this diagnosed? This condition is diagnosed based on:  Medical history.  Physical exam.  Blood tests.  Imaging tests. How is this treated? Treatment for this condition may include:  Medicines to treat infections or allergies.  Home care, such as: ? Rest. ? Placing cold or warm cloths (compresses) on the skin.  Hospital care, if the condition is very bad. Follow these instructions at home: Medicines  Take over-the-counter and prescription medicines only as told by your doctor.  If you were prescribed an antibiotic medicine,  take it as told by your doctor. Do not stop taking it even if you start to feel better. General instructions   Drink enough fluid to keep your pee (urine) pale yellow.  Do not touch or rub the infected area.  Raise (elevate) the infected area above the level of your heart while you are sitting or lying down.  Place cold or warm cloths on the area as told by your doctor.  Keep all follow-up visits as told by your doctor. This is important. Contact a doctor if:  You have a fever.  You do not start to get better after 1-2 days of treatment.  Your bone or joint under the infected area starts to hurt after the skin has healed.  Your infection comes back. This can happen in the same area or  another area.  You have a swollen bump in the area.  You have new symptoms.  You feel ill and have muscle aches and pains. Get help right away if:  Your symptoms get worse.  You feel very sleepy.  You throw up (vomit) or have watery poop (diarrhea) for a long time.  You see red streaks coming from the area.  Your red area gets larger.  Your red area turns dark in color. These symptoms may represent a serious problem that is an emergency. Do not wait to see if the symptoms will go away. Get medical help right away. Call your local emergency services (911 in the [place] Do not drive yourself to the hospital. Summary  Cellulitis is a skin infection. The area is often warm, red, swollen, and sore.  This condition is treated with medicines, rest, and cold and warm cloths.  Take all medicines only as told by your doctor.  Tell your doctor if symptoms do not start to get better after 1-2 days of treatment. This information is not intended to replace advice given to you by your health care provider. Make sure you discuss any questions you have with your health care provider. Document Revised: 02/20/2018 Document Reviewed: 02/20/2018 Elsevier Patient Education  2020 [employer].

## 2020-05-24 NOTE — Assessment & Plan Note (Signed)
Patient is reporting new dermatitis that started 2 to 4 weeks ago.  Dermatitis is not well managed and gradually worsening.  Skin around fingernail bed is red, itchy, scaly, peeling with little blisters.  Patient unable to say what started symptoms.  Patient has not changed detergents, new medication or diet. Provided education to patient with printed handouts given. Started patient on a moderate potency topical corticosteroid. Patient follow-up in 4 weeks or with worsening and unresolved symptoms.

## 2020-05-25 LAB — ANA COMPREHENSIVE PANEL
Anti JO-1: <0.2 AI (ref 0.0–0.9)
Centromere Ab Screen: <0.2 AI (ref 0.0–0.9)
Chromatin Ab SerPl-aCnc: <0.2 AI (ref 0.0–0.9)
ENA RNP Ab: <0.2 AI (ref 0.0–0.9)
ENA SM Ab Ser-aCnc: <0.2 AI (ref 0.0–0.9)
ENA SSA (RO) Ab: <0.2 AI (ref 0.0–0.9)
ENA SSB (LA) Ab: <0.2 AI (ref 0.0–0.9)
Scleroderma (Scl-70) (ENA) Antibody, IgG: <0.2 AI (ref 0.0–0.9)
dsDNA Ab: 2 IU/mL (ref 0–9)

## 2020-05-25 LAB — CBC WITH DIFFERENTIAL/PLATELET
Basophils Absolute: 0 10*3/uL (ref 0.0–0.2)
Basos: 0 %
EOS (ABSOLUTE): 0.5 10*3/uL — ABNORMAL HIGH (ref 0.0–0.4)
Eos: 4 %
Hematocrit: 37.4 % (ref 34.0–46.6)
Hemoglobin: 11.3 g/dL (ref 11.1–15.9)
Immature Grans (Abs): 0 10*3/uL (ref 0.0–0.1)
Immature Granulocytes: 0 %
Lymphocytes Absolute: 2.7 10*3/uL (ref 0.7–3.1)
Lymphs: 23 %
MCH: 22.6 pg — ABNORMAL LOW (ref 26.6–33.0)
MCHC: 30.2 g/dL — ABNORMAL LOW (ref 31.5–35.7)
MCV: 75 fL — ABNORMAL LOW (ref 79–97)
Monocytes Absolute: 1.2 10*3/uL — ABNORMAL HIGH (ref 0.1–0.9)
Monocytes: 10 %
Neutrophils Absolute: 7.6 10*3/uL — ABNORMAL HIGH (ref 1.4–7.0)
Neutrophils: 63 %
Platelets: 395 10*3/uL (ref 150–450)
RBC: 5 x10E6/uL (ref 3.77–5.28)
RDW: 14.3 % (ref 11.7–15.4)
WBC: 12 10*3/uL — ABNORMAL HIGH (ref 3.4–10.8)

## 2020-05-25 LAB — FERRITIN: Ferritin: 308 ng/mL — ABNORMAL HIGH (ref 15–150)

## 2020-05-25 LAB — SEDIMENTATION RATE: Sed Rate: 59 mm/hr — ABNORMAL HIGH (ref 0–40)

## 2020-05-25 LAB — VITAMIN B12: Vitamin B-12: 557 pg/mL (ref 232–1245)

## 2020-05-30 DIAGNOSIS — J309 Allergic rhinitis, unspecified: Secondary | ICD-10-CM | POA: Diagnosis not present

## 2020-05-30 DIAGNOSIS — L309 Dermatitis, unspecified: Secondary | ICD-10-CM | POA: Diagnosis not present

## 2020-05-30 DIAGNOSIS — I10 Essential (primary) hypertension: Secondary | ICD-10-CM | POA: Diagnosis not present

## 2020-06-27 ENCOUNTER — Ambulatory Visit (INDEPENDENT_AMBULATORY_CARE_PROVIDER_SITE_OTHER): Payer: BC Managed Care – PPO | Admitting: Nurse Practitioner

## 2020-06-27 ENCOUNTER — Encounter: Payer: Self-pay | Admitting: Nurse Practitioner

## 2020-06-27 ENCOUNTER — Other Ambulatory Visit: Payer: Self-pay

## 2020-06-27 VITALS — BP 143/77 | HR 80 | Temp 97.4°F | Ht 62.0 in | Wt 261.0 lb

## 2020-06-27 DIAGNOSIS — I10 Essential (primary) hypertension: Secondary | ICD-10-CM

## 2020-06-27 DIAGNOSIS — L309 Dermatitis, unspecified: Secondary | ICD-10-CM | POA: Diagnosis not present

## 2020-06-27 DIAGNOSIS — L03116 Cellulitis of left lower limb: Secondary | ICD-10-CM | POA: Diagnosis not present

## 2020-06-27 NOTE — Progress Notes (Signed)
Established Patient Office Visit  Subjective:  Patient ID: Rachel Pearson, female    DOB: 1966-08-03  Age: 54 y.o. MRN: [id number]  CC:  Chief Complaint  Patient presents with   Follow-up    dermatitis    HPI Rachel GovernGloria M Cancel presents for unresolved dermatitis in the last 4 weeks.  Patient reports symptoms are unchanged.  Patient completed treatment.  Patient used Kenalog 0.1% with no therapeutic effect.  Patient is reporting bilateral lower trace edema.  Generalized rash on arms thighs and legs.  Patient is not reporting fever, or pain.  Good morning  Patient presents for follow up of hypertension. Patient was diagnosed in 07/09/2013. The patient is tolerating the medication well without side effects. Compliance with treatment has been good; including taking medication as directed , maintains a healthy diet , and following up as directed. Patient is currently on Cozaar 50 mg tablet daily.  Past Medical History:  Diagnosis Date   Allergy    Anemia    Hypertension     Past Surgical History:  Procedure Laterality Date   COLONOSCOPY WITH PROPOFOL N/A 04/09/2019   Procedure: COLONOSCOPY WITH PROPOFOL;  Surgeon: Corbin Adeourk, Robert M, MD;  Location: [facility];  Service: Endoscopy;  Laterality: N/A;  7:30am   EYE SURGERY Bilateral    x 2 (bilateral)   POLYPECTOMY  04/09/2019   Procedure: POLYPECTOMY;  Surgeon: Corbin Adeourk, Robert M, MD;  Location: [facility];  Service: Endoscopy;;  rectal    ROTATOR CUFF REPAIR Left    removal of bone spurs   TUBAL LIGATION      Family History  Problem Relation Age of Onset   Leukemia Mother    Diabetes Mother    Hypertension Mother    Diabetes Father    Hypertension Father    Thyroid disease Sister    Hypertension Brother    Hypertension Brother    Colon cancer Neg Hx     Social History   Socioeconomic History   Marital status: Married    Spouse name: Not on file   Number of children: Not on file   Years of education:  Not on file   Highest education level: Not on file  Occupational History   Not on file  Tobacco Use   Smoking status: Never Smoker   Smokeless tobacco: Never Used  Vaping Use   Vaping Use: Never used  Substance and Sexual Activity   Alcohol use: No   Drug use: No   Sexual activity: Yes  Other Topics Concern   Not on file  Social History Narrative   Not on file   Social Determinants of Health   Financial Resource Strain:    Difficulty of Paying Living Expenses: Not on file  Food Insecurity:    Worried About Running Out of Food in the Last Year: Not on file   [employer] Out of Food in the Last Year: Not on file  Transportation Needs:    Lack of Transportation (Medical): Not on file   Lack of Transportation (Non-Medical): Not on file  Physical Activity:    Days of Exercise per Week: Not on file   Minutes of Exercise per Session: Not on file  Stress:    Feeling of Stress : Not on file  Social Connections:    Frequency of Communication with Friends and Family: Not on file   Frequency of Social Gatherings with Friends and Family: Not on file   Attends Religious Services: Not on file  Active Member of Clubs or Organizations: Not on file   Attends Banker Meetings: Not on file   Marital Status: Not on file  Intimate Partner Violence:    Fear of Current or Ex-Partner: Not on file   Emotionally Abused: Not on file   Physically Abused: Not on file   Sexually Abused: Not on file    Outpatient Medications Prior to Visit  Medication Sig Dispense Refill   amoxicillin-clavulanate (AUGMENTIN) 875-125 MG tablet Take 1 tablet by mouth 2 (two) times daily. 20 tablet 0   Cholecalciferol (VITAMIN D-3) 5000 UNITS TABS Take 5,000 Units by mouth daily.      clotrimazole (LOTRIMIN) 1 % cream Apply 1 application topically 2 (two) times daily as needed (irritation). 60 g 5   diclofenac sodium (VOLTAREN) 1 % GEL Apply 1 application topically 2 (two) times  daily as needed (shoulder pain).   0   fluticasone (FLONASE) 50 MCG/ACT nasal spray Place 2 sprays into both nostrils daily as needed for allergies. 48 g 3   gabapentin (NEURONTIN) 100 MG capsule Take 1 capsule (100 mg total) by mouth 3 (three) times daily. 270 capsule 3   hydrochlorothiazide (HYDRODIURIL) 25 MG tablet Take 1 tablet (25 mg total) by mouth daily. 90 tablet 3   hydrocortisone 2.5 % cream Apply topically 2 (two) times daily. 30 g 5   losartan (COZAAR) 50 MG tablet Take 1 tablet (50 mg total) by mouth daily. 90 tablet 3   meloxicam (MOBIC) 7.5 MG tablet Take 1 tablet (7.5 mg total) by mouth daily. 90 tablet 3   Multiple Vitamins-Minerals (CENTURY-VITE PO) Take 1 tablet by mouth daily.      triamcinolone cream (KENALOG) 0.1 % Apply 1 application topically 2 (two) times daily. 30 g 2   No facility-administered medications prior to visit.    No Known Allergies  ROS Review of Systems  Skin: Positive for color change and rash.  All other systems reviewed and are negative.     Objective:    Physical Exam Vitals reviewed.  Constitutional:      Appearance: Normal appearance.       Comments: Trace edema, dry rash  HENT:     Head: Normocephalic.  Eyes:     Conjunctiva/sclera: Conjunctivae normal.  Cardiovascular:     Rate and Rhythm: Normal rate and regular rhythm.     Pulses: Normal pulses.     Heart sounds: Normal heart sounds.  Pulmonary:     Effort: Pulmonary effort is normal.     Breath sounds: Normal breath sounds.  Abdominal:     General: Bowel sounds are normal.  Musculoskeletal:     Cervical back: Normal range of motion.     Right lower leg: Edema present.     Left lower leg: Edema present.     Comments: Trace edema  Skin:    General: Skin is dry.     Findings: Erythema and rash present.     Comments: Red patches  Neurological:     Mental Status: She is alert and oriented to person, place, and time.  Psychiatric:        Mood and Affect: Mood  normal.     Temp (!) 97.4 F (36.3 C)    Ht 5\' 2"  (1.575 m)    Wt 261 lb (118.4 kg)    BMI 47.74 kg/m  Wt Readings from Last 3 Encounters:  06/27/20 261 lb (118.4 kg)  05/24/20 258 lb (117 kg)  11/30/19 246  lb 3.2 oz (111.7 kg)     Health Maintenance Due  Topic Date Due   Hepatitis C Screening  Never done   HIV Screening  Never done   INFLUENZA VACCINE  05/15/2020    There are no preventive care reminders to display for this patient.  Lab Results  Component Value Date   TSH 1.800 11/30/2019   Lab Results  Component Value Date   WBC 12.0 (H) 05/24/2020   HGB 11.3 05/24/2020   HCT 37.4 05/24/2020   MCV 75 (L) 05/24/2020   PLT 395 05/24/2020   Lab Results  Component Value Date   NA 141 11/30/2019   K 4.2 11/30/2019   CO2 23 11/30/2019   GLUCOSE 98 11/30/2019   BUN 18 11/30/2019   CREATININE 0.82 11/30/2019   BILITOT 0.3 11/30/2019   ALKPHOS 83 11/30/2019   AST 15 11/30/2019   ALT 12 11/30/2019   PROT 7.6 11/30/2019   ALBUMIN 4.1 11/30/2019   CALCIUM 9.5 11/30/2019   ANIONGAP 13 04/03/2019   Lab Results  Component Value Date   CHOL 181 11/30/2019   Lab Results  Component Value Date   HDL 57 11/30/2019   Lab Results  Component Value Date   LDLCALC 108 (H) 11/30/2019   Lab Results  Component Value Date   TRIG 88 11/30/2019   Lab Results  Component Value Date   CHOLHDL 3.2 11/30/2019   No results found for: HGBA1C    Assessment & Plan:   Problem List Items Addressed This Visit      Cardiovascular and Mediastinum   Hypertension    Patient is following up for hypertension.  Diagnosed in 2014, patient is tolerating medication well without side effects.  Compliance with treatment has been good.  Including taking medication as directed, maintains a healthy diet, follows up as directed.  Patient is currently taking 50 mg Cozaar tablet daily.  Patient's blood pressure slightly elevated today patient is reporting that this is probably because of  her visit in clinic today but she has maintained good blood pressure numbers at home.  Advised patient to continue to monitor blood pressure and call with elevated numbers.  Advised patient to continue healthy low-salt diet and incorporate exercise.   Follow-up as needed        Musculoskeletal and Integument   Dermatitis - Primary    Patient is a 54 year old female who presents to clinic with unresolved dermatitis in the last 4 weeks.  Patient reports symptoms are unchanged.  Patient completed and Kenalog 0.1% with no therapeutic effect.  Patient is reporting trace bilateral lower extremity edema.  Rash and resolved on arms thighs and legs.  Patient denies fever or pain today.  Provided education to patient with printed handouts given, went over patient" medication, discontinue/hold off meloxicam.  [Medication has a side effect of skin rash, and swelling].  Advised patient to use Tylenol for pain as needed. Dermatology referral completed. Follow-up with unresolved or worsening symptoms.      Relevant Orders   Ambulatory referral to Dermatology     Other   Cellulitis of left lower extremity    Cellulitis of the left lower extremity resolved.  Patient completed antibiotic in the last 3 to 4 weeks.  Provided education to patient to continue to keep feet elevated and wear compression socks as needed, report any new swelling with erythema.   Follow-up as needed.           Follow-up: Return if symptoms worsen or  fail to improve.    Daryll Drown, NP

## 2020-06-27 NOTE — Addendum Note (Signed)
Addended by: Daryll Drown on: 06/27/2020 09:55 AM   Modules accepted: Orders

## 2020-06-27 NOTE — Assessment & Plan Note (Signed)
Cellulitis of the left lower extremity resolved.  Patient completed antibiotic in the last 3 to 4 weeks.  Provided education to patient to continue to keep feet elevated and wear compression socks as needed, report any new swelling with erythema.   Follow-up as needed.

## 2020-06-27 NOTE — Assessment & Plan Note (Signed)
Patient is a 54 year old female who presents to clinic with unresolved dermatitis in the last 4 weeks.  Patient reports symptoms are unchanged.  Patient completed and Kenalog 0.1% with no therapeutic effect.  Patient is reporting trace bilateral lower extremity edema.  Rash and resolved on arms thighs and legs.  Patient denies fever or pain today.  Provided education to patient with printed handouts given, went over patient" medication, discontinue/hold off meloxicam.  [Medication has a side effect of skin rash, and swelling].  Advised patient to use Tylenol for pain as needed. Dermatology referral completed. Follow-up with unresolved or worsening symptoms.

## 2020-06-27 NOTE — Assessment & Plan Note (Signed)
Patient is following up for hypertension.  Diagnosed in 2014, patient is tolerating medication well without side effects.  Compliance with treatment has been good.  Including taking medication as directed, maintains a healthy diet, follows up as directed.  Patient is currently taking 50 mg Cozaar tablet daily.  Patient's blood pressure slightly elevated today patient is reporting that this is probably because of her visit in clinic today but she has maintained good blood pressure numbers at home.  Advised patient to continue to monitor blood pressure and call with elevated numbers.  Advised patient to continue healthy low-salt diet and incorporate exercise.   Follow-up as needed

## 2020-06-27 NOTE — Patient Instructions (Signed)
Cellulitis, Adult  Cellulitis is a skin infection. The infected area is often warm, red, swollen, and sore. It occurs most often in the arms and lower legs. It is very important to get treated for this condition. What are the causes? This condition is caused by bacteria. The bacteria enter through a break in the skin, such as a cut, burn, insect bite, open sore, or crack. What increases the risk? This condition is more likely to occur in people who:  Have a weak body defense system (immune system).  Have open cuts, burns, bites, or scrapes on the skin.  Are older than 54 years of age.  Have a blood sugar problem (diabetes).  Have a long-lasting (chronic) liver disease (cirrhosis) or kidney disease.  Are very overweight (obese).  Have a skin problem, such as: ? Itchy rash (eczema). ? Slow movement of blood in the veins (venous stasis). ? Fluid buildup below the skin (edema).  Have been treated with high-energy rays (radiation).  Use IV drugs. What are the signs or symptoms? Symptoms of this condition include:  Skin that is: ? Red. ? Streaking. ? Spotting. ? Swollen. ? Sore or painful when you touch it. ? Warm.  A fever.  Chills.  Blisters. How is this diagnosed? This condition is diagnosed based on:  Medical history.  Physical exam.  Blood tests.  Imaging tests. How is this treated? Treatment for this condition may include:  Medicines to treat infections or allergies.  Home care, such as: ? Rest. ? Placing cold or warm cloths (compresses) on the skin.  Hospital care, if the condition is very bad. Follow these instructions at home: Medicines  Take over-the-counter and prescription medicines only as told by your doctor.  If you were prescribed an antibiotic medicine, take it as told by your doctor. Do not stop taking it even if you start to feel better. General instructions   Drink enough fluid to keep your pee (urine) pale yellow.  Do not touch  or rub the infected area.  Raise (elevate) the infected area above the level of your heart while you are sitting or lying down.  Place cold or warm cloths on the area as told by your doctor.  Keep all follow-up visits as told by your doctor. This is important. Contact a doctor if:  You have a fever.  You do not start to get better after 1-2 days of treatment.  Your bone or joint under the infected area starts to hurt after the skin has healed.  Your infection comes back. This can happen in the same area or another area.  You have a swollen bump in the area.  You have new symptoms.  You feel ill and have muscle aches and pains. Get help right away if:  Your symptoms get worse.  You feel very sleepy.  You throw up (vomit) or have watery poop (diarrhea) for a long time.  You see red streaks coming from the area.  Your red area gets larger.  Your red area turns dark in color. These symptoms may represent a serious problem that is an emergency. Do not wait to see if the symptoms will go away. Get medical help right away. Call your local emergency services (911 in the [place] Do not drive yourself to the hospital. Summary  Cellulitis is a skin infection. The area is often warm, red, swollen, and sore.  This condition is treated with medicines, rest, and cold and warm cloths.  Take all medicines only   as told by your doctor.  Tell your doctor if symptoms do not start to get better after 1-2 days of treatment. This information is not intended to replace advice given to you by your health care provider. Make sure you discuss any questions you have with your health care provider. Document Revised: 02/20/2018 Document Reviewed: 02/20/2018 Elsevier Patient Education  2020 [employer] Atopic Dermatitis Atopic dermatitis is a skin disorder that causes inflammation of the skin. This is the most common type of eczema. Eczema is a group of skin conditions that cause the skin to be  itchy, red, and swollen. This condition is generally worse during the cooler winter months and often improves during the warm summer months. Symptoms can vary from person to person. Atopic dermatitis usually starts showing signs in infancy and can last through adulthood. This condition cannot be passed from one person to another (non-contagious), but it is more common in families. Atopic dermatitis may not always be present. When it is present, it is called a flare-up. What are the causes? The exact cause of this condition is not known. Flare-ups of the condition may be triggered by:  Contact with something that you are sensitive or allergic to.  Stress.  Certain foods.  Extremely hot or cold weather.  Harsh chemicals and soaps.  Dry air.  Chlorine. What increases the risk? This condition is more likely to develop in people who have a personal history or family history of eczema, allergies, asthma, or hay fever. What are the signs or symptoms? Symptoms of this condition include:  Dry, scaly skin.  Red, itchy rash.  Itchiness, which can be severe. This may occur before the skin rash. This can make sleeping difficult.  Skin thickening and cracking that can occur over time. How is this diagnosed? This condition is diagnosed based on your symptoms, a medical history, and a physical exam. How is this treated? There is no cure for this condition, but symptoms can usually be controlled. Treatment focuses on:  Controlling the itchiness and scratching. You may be given medicines, such as antihistamines or steroid creams.  Limiting exposure to things that you are sensitive or allergic to (allergens).  Recognizing situations that cause stress and developing a plan to manage stress. If your atopic dermatitis does not get better with medicines, or if it is all over your body (widespread), a treatment using a specific type of light (phototherapy) may be used. Follow these instructions at  home: Skin care   Keep your skin well-moisturized. Doing this seals in moisture and helps to prevent dryness. ? Use unscented lotions that have petroleum in them. ? Avoid lotions that contain alcohol or water. They can dry the skin.  Keep baths or showers short (less than 5 minutes) in warm water. Do not use hot water. ? Use mild, unscented cleansers for bathing. Avoid soap and bubble bath. ? Apply a moisturizer to your skin right after a bath or shower.  Do not apply anything to your skin without checking with your health care provider. General instructions  Dress in clothes made of cotton or cotton blends. Dress lightly because heat increases itchiness.  When washing your clothes, rinse your clothes twice so all of the soap is removed.  Avoid any triggers that can cause a flare-up.  Try to manage your stress.  Keep your fingernails cut short.  Avoid scratching. Scratching makes the rash and itchiness worse. It may also result in a skin infection (impetigo) due to a break in  the skin caused by scratching.  Take or apply over-the-counter and prescription medicines only as told by your health care provider.  Keep all follow-up visits as told by your health care provider. This is important.  Do not be around people who have cold sores or fever blisters. If you get the infection, it may cause your atopic dermatitis to worsen. Contact a health care provider if:  Your itchiness interferes with sleep.  Your rash gets worse or it is not better within one week of starting treatment.  You have a fever.  You have a rash flare-up after having contact with someone who has cold sores or fever blisters. Get help right away if:  You develop pus or soft yellow scabs in the rash area. Summary  This condition causes a red rash and itchy, dry, scaly skin.  Treatment focuses on controlling the itchiness and scratching, limiting exposure to things that you are sensitive or allergic to  (allergens), recognizing situations that cause stress, and developing a plan to manage stress.  Keep your skin well-moisturized.  Keep baths or showers shorter than 5 minutes and use warm water. Do not use hot water. This information is not intended to replace advice given to you by your health care provider. Make sure you discuss any questions you have with your health care provider. Document Revised: 01/20/2019 Document Reviewed: 11/02/2016 Elsevier Patient Education  2020 [employer].

## 2020-07-08 ENCOUNTER — Other Ambulatory Visit: Payer: Self-pay | Admitting: *Deleted

## 2020-07-08 DIAGNOSIS — I10 Essential (primary) hypertension: Secondary | ICD-10-CM

## 2020-07-08 MED ORDER — LOSARTAN POTASSIUM 50 MG PO TABS: 50.0000 mg | ORAL_TABLET | Freq: Every day | ORAL | 1 refills | Status: AC

## 2020-07-19 DIAGNOSIS — I10 Essential (primary) hypertension: Secondary | ICD-10-CM | POA: Diagnosis not present

## 2020-07-19 DIAGNOSIS — L309 Dermatitis, unspecified: Secondary | ICD-10-CM | POA: Diagnosis not present

## 2020-08-18 DIAGNOSIS — I10 Essential (primary) hypertension: Secondary | ICD-10-CM | POA: Diagnosis not present

## 2020-08-31 ENCOUNTER — Other Ambulatory Visit (HOSPITAL_COMMUNITY): Payer: Self-pay | Admitting: Family

## 2020-08-31 ENCOUNTER — Other Ambulatory Visit (HOSPITAL_COMMUNITY): Payer: Self-pay

## 2020-08-31 DIAGNOSIS — Z1231 Encounter for screening mammogram for malignant neoplasm of breast: Secondary | ICD-10-CM

## 2020-09-22 DIAGNOSIS — H524 Presbyopia: Secondary | ICD-10-CM | POA: Diagnosis not present

## 2020-09-22 DIAGNOSIS — H26491 Other secondary cataract, right eye: Secondary | ICD-10-CM | POA: Diagnosis not present

## 2020-09-22 DIAGNOSIS — Z961 Presence of intraocular lens: Secondary | ICD-10-CM | POA: Diagnosis not present

## 2020-09-22 DIAGNOSIS — H02831 Dermatochalasis of right upper eyelid: Secondary | ICD-10-CM | POA: Diagnosis not present

## 2020-09-22 DIAGNOSIS — H5203 Hypermetropia, bilateral: Secondary | ICD-10-CM | POA: Diagnosis not present

## 2020-09-22 DIAGNOSIS — H02834 Dermatochalasis of left upper eyelid: Secondary | ICD-10-CM | POA: Diagnosis not present

## 2020-09-30 ENCOUNTER — Ambulatory Visit (HOSPITAL_COMMUNITY)
Admission: RE | Admit: 2020-09-30 | Discharge: 2020-09-30 | Disposition: A | Discharge status: Home / Self Care | Payer: BC Managed Care – PPO | Source: Ambulatory Visit | Attending: Family | Admitting: Family

## 2020-09-30 ENCOUNTER — Other Ambulatory Visit: Payer: Self-pay

## 2020-09-30 DIAGNOSIS — Z1231 Encounter for screening mammogram for malignant neoplasm of breast: Secondary | ICD-10-CM | POA: Diagnosis not present

## 2020-12-05 DIAGNOSIS — I10 Essential (primary) hypertension: Secondary | ICD-10-CM | POA: Diagnosis not present

## 2020-12-05 DIAGNOSIS — Z01419 Encounter for gynecological examination (general) (routine) without abnormal findings: Secondary | ICD-10-CM | POA: Diagnosis not present

## 2020-12-05 DIAGNOSIS — J309 Allergic rhinitis, unspecified: Secondary | ICD-10-CM | POA: Diagnosis not present

## 2020-12-05 DIAGNOSIS — Z0000 Encounter for general adult medical examination without abnormal findings: Secondary | ICD-10-CM | POA: Diagnosis not present

## 2020-12-05 DIAGNOSIS — R202 Paresthesia of skin: Secondary | ICD-10-CM | POA: Diagnosis not present

## 2020-12-05 DIAGNOSIS — L309 Dermatitis, unspecified: Secondary | ICD-10-CM | POA: Diagnosis not present

## 2020-12-20 DIAGNOSIS — B351 Tinea unguium: Secondary | ICD-10-CM | POA: Diagnosis not present

## 2020-12-20 DIAGNOSIS — M79676 Pain in unspecified toe(s): Secondary | ICD-10-CM | POA: Diagnosis not present

## 2020-12-22 DIAGNOSIS — B351 Tinea unguium: Secondary | ICD-10-CM | POA: Diagnosis not present

## 2020-12-22 DIAGNOSIS — B353 Tinea pedis: Secondary | ICD-10-CM | POA: Diagnosis not present

## 2021-03-14 DIAGNOSIS — B351 Tinea unguium: Secondary | ICD-10-CM | POA: Diagnosis not present

## 2021-06-13 DIAGNOSIS — B353 Tinea pedis: Secondary | ICD-10-CM | POA: Diagnosis not present

## 2021-06-13 DIAGNOSIS — B351 Tinea unguium: Secondary | ICD-10-CM | POA: Diagnosis not present

## 2021-08-10 ENCOUNTER — Other Ambulatory Visit (HOSPITAL_COMMUNITY): Payer: Self-pay | Admitting: Family

## 2021-08-10 DIAGNOSIS — Z1231 Encounter for screening mammogram for malignant neoplasm of breast: Secondary | ICD-10-CM

## 2021-08-23 DIAGNOSIS — Z23 Encounter for immunization: Secondary | ICD-10-CM | POA: Diagnosis not present

## 2021-08-23 DIAGNOSIS — L309 Dermatitis, unspecified: Secondary | ICD-10-CM | POA: Diagnosis not present

## 2021-08-23 DIAGNOSIS — I10 Essential (primary) hypertension: Secondary | ICD-10-CM | POA: Diagnosis not present

## 2021-08-23 DIAGNOSIS — J309 Allergic rhinitis, unspecified: Secondary | ICD-10-CM | POA: Diagnosis not present

## 2021-10-02 ENCOUNTER — Other Ambulatory Visit: Payer: Self-pay

## 2021-10-02 ENCOUNTER — Ambulatory Visit (HOSPITAL_COMMUNITY)
Admission: RE | Admit: 2021-10-02 | Discharge: 2021-10-02 | Disposition: A | Discharge status: Home / Self Care | Payer: BC Managed Care – PPO | Source: Ambulatory Visit | Attending: Family | Admitting: Family

## 2021-10-02 DIAGNOSIS — Z1231 Encounter for screening mammogram for malignant neoplasm of breast: Secondary | ICD-10-CM | POA: Diagnosis not present

## 2021-10-05 ENCOUNTER — Other Ambulatory Visit (HOSPITAL_COMMUNITY): Payer: Self-pay | Admitting: Family

## 2021-10-05 DIAGNOSIS — R928 Other abnormal and inconclusive findings on diagnostic imaging of breast: Secondary | ICD-10-CM

## 2021-10-19 ENCOUNTER — Ambulatory Visit (HOSPITAL_COMMUNITY)
Admission: RE | Admit: 2021-10-19 | Discharge: 2021-10-19 | Disposition: A | Discharge status: Home / Self Care | Payer: BC Managed Care – PPO | Source: Ambulatory Visit | Attending: Family | Admitting: Family

## 2021-10-19 ENCOUNTER — Other Ambulatory Visit: Payer: Self-pay

## 2021-10-19 DIAGNOSIS — R928 Other abnormal and inconclusive findings on diagnostic imaging of breast: Secondary | ICD-10-CM

## 2021-10-20 ENCOUNTER — Other Ambulatory Visit: Payer: Self-pay | Admitting: Family

## 2021-10-20 ENCOUNTER — Other Ambulatory Visit (HOSPITAL_COMMUNITY): Payer: Self-pay | Admitting: Family

## 2021-10-20 DIAGNOSIS — N649 Disorder of breast, unspecified: Secondary | ICD-10-CM

## 2021-10-20 DIAGNOSIS — R928 Other abnormal and inconclusive findings on diagnostic imaging of breast: Secondary | ICD-10-CM

## 2021-11-06 ENCOUNTER — Ambulatory Visit
Admission: RE | Admit: 2021-11-06 | Discharge: 2021-11-06 | Disposition: A | Discharge status: Home / Self Care | Payer: BC Managed Care – PPO | Source: Ambulatory Visit | Attending: Family | Admitting: Family

## 2021-11-06 DIAGNOSIS — N649 Disorder of breast, unspecified: Secondary | ICD-10-CM

## 2021-11-06 DIAGNOSIS — R928 Other abnormal and inconclusive findings on diagnostic imaging of breast: Secondary | ICD-10-CM

## 2021-11-28 ENCOUNTER — Ambulatory Visit: Payer: Self-pay | Admitting: General Surgery

## 2021-11-28 DIAGNOSIS — N6312 Unspecified lump in the right breast, upper inner quadrant: Secondary | ICD-10-CM

## 2021-12-05 ENCOUNTER — Other Ambulatory Visit: Payer: Self-pay | Admitting: General Surgery

## 2021-12-05 DIAGNOSIS — N6312 Unspecified lump in the right breast, upper inner quadrant: Secondary | ICD-10-CM

## 2022-01-03 ENCOUNTER — Other Ambulatory Visit: Payer: Self-pay

## 2022-01-03 ENCOUNTER — Encounter (HOSPITAL_BASED_OUTPATIENT_CLINIC_OR_DEPARTMENT_OTHER): Payer: Self-pay | Admitting: General Surgery

## 2022-01-08 ENCOUNTER — Encounter (HOSPITAL_BASED_OUTPATIENT_CLINIC_OR_DEPARTMENT_OTHER)
Admission: RE | Admit: 2022-01-08 | Discharge: 2022-01-08 | Disposition: A | Discharge status: Home / Self Care | Payer: BC Managed Care – PPO | Source: Ambulatory Visit | Attending: General Surgery | Admitting: General Surgery

## 2022-01-08 DIAGNOSIS — Z01810 Encounter for preprocedural cardiovascular examination: Secondary | ICD-10-CM | POA: Diagnosis present

## 2022-01-08 NOTE — Progress Notes (Signed)

## 2022-01-11 ENCOUNTER — Ambulatory Visit
Admission: RE | Admit: 2022-01-11 | Discharge: 2022-01-11 | Disposition: A | Discharge status: Home / Self Care | Payer: BC Managed Care – PPO | Source: Ambulatory Visit | Attending: General Surgery | Admitting: General Surgery

## 2022-01-11 DIAGNOSIS — N6312 Unspecified lump in the right breast, upper inner quadrant: Secondary | ICD-10-CM

## 2022-01-12 ENCOUNTER — Ambulatory Visit (HOSPITAL_BASED_OUTPATIENT_CLINIC_OR_DEPARTMENT_OTHER)
Admission: RE | Admit: 2022-01-12 | Discharge: 2022-01-12 | Disposition: A | Discharge status: Home / Self Care | Payer: BC Managed Care – PPO | Source: Ambulatory Visit | Attending: General Surgery | Admitting: General Surgery

## 2022-01-12 ENCOUNTER — Encounter (HOSPITAL_BASED_OUTPATIENT_CLINIC_OR_DEPARTMENT_OTHER): Payer: Self-pay | Admitting: General Surgery

## 2022-01-12 ENCOUNTER — Ambulatory Visit (HOSPITAL_BASED_OUTPATIENT_CLINIC_OR_DEPARTMENT_OTHER): Payer: BC Managed Care – PPO | Admitting: Certified Registered"

## 2022-01-12 ENCOUNTER — Encounter (HOSPITAL_BASED_OUTPATIENT_CLINIC_OR_DEPARTMENT_OTHER)
Admission: RE | Disposition: A | Discharge status: Home / Self Care | Payer: Self-pay | Source: Ambulatory Visit | Attending: General Surgery

## 2022-01-12 ENCOUNTER — Ambulatory Visit
Admission: RE | Admit: 2022-01-12 | Discharge: 2022-01-12 | Disposition: A | Discharge status: Home / Self Care | Payer: BC Managed Care – PPO | Source: Ambulatory Visit | Attending: General Surgery | Admitting: General Surgery

## 2022-01-12 ENCOUNTER — Ambulatory Visit (HOSPITAL_COMMUNITY): Payer: BC Managed Care – PPO

## 2022-01-12 ENCOUNTER — Other Ambulatory Visit: Payer: Self-pay

## 2022-01-12 DIAGNOSIS — N6489 Other specified disorders of breast: Secondary | ICD-10-CM | POA: Insufficient documentation

## 2022-01-12 DIAGNOSIS — I10 Essential (primary) hypertension: Secondary | ICD-10-CM | POA: Insufficient documentation

## 2022-01-12 DIAGNOSIS — N6011 Diffuse cystic mastopathy of right breast: Secondary | ICD-10-CM | POA: Insufficient documentation

## 2022-01-12 DIAGNOSIS — Z6841 Body Mass Index (BMI) 40.0 and over, adult: Secondary | ICD-10-CM | POA: Diagnosis not present

## 2022-01-12 DIAGNOSIS — N6312 Unspecified lump in the right breast, upper inner quadrant: Secondary | ICD-10-CM

## 2022-01-12 HISTORY — DX: Other complications of anesthesia, initial encounter: T88.59XA

## 2022-01-12 HISTORY — PX: BREAST LUMPECTOMY WITH RADIOACTIVE SEED LOCALIZATION: SHX6424

## 2022-01-12 LAB — POCT PREGNANCY, URINE: Preg Test, Ur: NEGATIVE

## 2022-01-12 SURGERY — BREAST LUMPECTOMY WITH RADIOACTIVE SEED LOCALIZATION
Anesthesia: General | Site: Breast | Laterality: Right

## 2022-01-12 MED ORDER — DEXAMETHASONE SODIUM PHOSPHATE 10 MG/ML IJ SOLN
INTRAMUSCULAR | Status: AC
  Filled 2022-01-12: qty 1

## 2022-01-12 MED ORDER — OXYCODONE HCL 5 MG PO TABS: 5.0000 mg | ORAL_TABLET | Freq: Four times a day (QID) | ORAL | 0 refills | Status: DC | PRN

## 2022-01-12 MED ORDER — CHLORHEXIDINE GLUCONATE CLOTH 2 % EX PADS: 6.0000 | MEDICATED_PAD | Freq: Once | CUTANEOUS | Status: DC

## 2022-01-12 MED ORDER — OXYCODONE HCL 5 MG/5ML PO SOLN: 5.0000 mg | Freq: Once | ORAL | Status: DC | PRN

## 2022-01-12 MED ORDER — CEFAZOLIN SODIUM-DEXTROSE 2-4 GM/100ML-% IV SOLN
2.0000 g | INTRAVENOUS | Status: AC
  Administered 2022-01-12: 2 g via INTRAVENOUS

## 2022-01-12 MED ORDER — FENTANYL CITRATE (PF) 100 MCG/2ML IJ SOLN
INTRAMUSCULAR | Status: AC
  Filled 2022-01-12: qty 2

## 2022-01-12 MED ORDER — DEXAMETHASONE SODIUM PHOSPHATE 10 MG/ML IJ SOLN
INTRAMUSCULAR | Status: DC | PRN
  Administered 2022-01-12: 5 mg via INTRAVENOUS

## 2022-01-12 MED ORDER — PHENYLEPHRINE 40 MCG/ML (10ML) SYRINGE FOR IV PUSH (FOR BLOOD PRESSURE SUPPORT)
PREFILLED_SYRINGE | INTRAVENOUS | Status: AC
Start: 2022-01-12 — End: ?
  Filled 2022-01-12: qty 10

## 2022-01-12 MED ORDER — GABAPENTIN 300 MG PO CAPS
ORAL_CAPSULE | ORAL | Status: AC
  Filled 2022-01-12: qty 1

## 2022-01-12 MED ORDER — CEFAZOLIN SODIUM-DEXTROSE 2-4 GM/100ML-% IV SOLN
INTRAVENOUS | Status: AC
  Filled 2022-01-12: qty 100

## 2022-01-12 MED ORDER — ONDANSETRON HCL 4 MG/2ML IJ SOLN
INTRAMUSCULAR | Status: AC
  Filled 2022-01-12: qty 2

## 2022-01-12 MED ORDER — LACTATED RINGERS IV SOLN: INTRAVENOUS | Status: DC

## 2022-01-12 MED ORDER — SUCCINYLCHOLINE CHLORIDE 200 MG/10ML IV SOSY
PREFILLED_SYRINGE | INTRAVENOUS | Status: DC | PRN
  Administered 2022-01-12: 120 mg via INTRAVENOUS

## 2022-01-12 MED ORDER — ONDANSETRON HCL 4 MG/2ML IJ SOLN
INTRAMUSCULAR | Status: DC | PRN
  Administered 2022-01-12: 4 mg via INTRAVENOUS

## 2022-01-12 MED ORDER — ACETAMINOPHEN 500 MG PO TABS
ORAL_TABLET | ORAL | Status: AC
Start: 2022-01-12 — End: ?
  Filled 2022-01-12: qty 2

## 2022-01-12 MED ORDER — AMISULPRIDE (ANTIEMETIC) 5 MG/2ML IV SOLN: 10.0000 mg | Freq: Once | INTRAVENOUS | Status: DC | PRN

## 2022-01-12 MED ORDER — ACETAMINOPHEN 500 MG PO TABS
1000.0000 mg | ORAL_TABLET | ORAL | Status: AC
  Administered 2022-01-12: 1000 mg via ORAL

## 2022-01-12 MED ORDER — FENTANYL CITRATE (PF) 100 MCG/2ML IJ SOLN: 25.0000 ug | INTRAMUSCULAR | Status: DC | PRN

## 2022-01-12 MED ORDER — CELECOXIB 200 MG PO CAPS
200.0000 mg | ORAL_CAPSULE | ORAL | Status: AC
  Administered 2022-01-12: 200 mg via ORAL

## 2022-01-12 MED ORDER — PROPOFOL 10 MG/ML IV BOLUS
INTRAVENOUS | Status: DC | PRN
  Administered 2022-01-12: 100 mg via INTRAVENOUS
  Administered 2022-01-12: 200 mg via INTRAVENOUS

## 2022-01-12 MED ORDER — PROPOFOL 10 MG/ML IV BOLUS
INTRAVENOUS | Status: AC
  Filled 2022-01-12: qty 20

## 2022-01-12 MED ORDER — MIDAZOLAM HCL 2 MG/2ML IJ SOLN
INTRAMUSCULAR | Status: AC
Start: 2022-01-12 — End: ?
  Filled 2022-01-12: qty 2

## 2022-01-12 MED ORDER — LIDOCAINE 2% (20 MG/ML) 5 ML SYRINGE
INTRAMUSCULAR | Status: AC
Start: 2022-01-12 — End: ?
  Filled 2022-01-12: qty 5

## 2022-01-12 MED ORDER — OXYCODONE HCL 5 MG PO TABS: 5.0000 mg | ORAL_TABLET | Freq: Once | ORAL | Status: DC | PRN

## 2022-01-12 MED ORDER — GABAPENTIN 300 MG PO CAPS
300.0000 mg | ORAL_CAPSULE | ORAL | Status: AC
  Administered 2022-01-12: 300 mg via ORAL

## 2022-01-12 MED ORDER — BUPIVACAINE-EPINEPHRINE (PF) 0.25% -1:200000 IJ SOLN
INTRAMUSCULAR | Status: DC | PRN
Start: 2022-01-12 — End: 2022-01-12
  Administered 2022-01-12: 20 mL via PERINEURAL

## 2022-01-12 MED ORDER — PHENYLEPHRINE HCL (PRESSORS) 10 MG/ML IV SOLN
INTRAVENOUS | Status: DC | PRN
  Administered 2022-01-12: 80 ug via INTRAVENOUS
  Administered 2022-01-12: 120 ug via INTRAVENOUS
  Administered 2022-01-12 (×2): 80 ug via INTRAVENOUS

## 2022-01-12 MED ORDER — CELECOXIB 200 MG PO CAPS
ORAL_CAPSULE | ORAL | Status: AC
Start: 2022-01-12 — End: ?
  Filled 2022-01-12: qty 1

## 2022-01-12 MED ORDER — FENTANYL CITRATE (PF) 100 MCG/2ML IJ SOLN
INTRAMUSCULAR | Status: DC | PRN
  Administered 2022-01-12 (×2): 50 ug via INTRAVENOUS

## 2022-01-12 MED ORDER — LIDOCAINE HCL (CARDIAC) PF 100 MG/5ML IV SOSY
PREFILLED_SYRINGE | INTRAVENOUS | Status: DC | PRN
  Administered 2022-01-12: 60 mg via INTRAVENOUS

## 2022-01-12 MED ORDER — MIDAZOLAM HCL 5 MG/5ML IJ SOLN
INTRAMUSCULAR | Status: DC | PRN
  Administered 2022-01-12: 2 mg via INTRAVENOUS

## 2022-01-12 MED ORDER — ALBUTEROL SULFATE HFA 108 (90 BASE) MCG/ACT IN AERS
INHALATION_SPRAY | RESPIRATORY_TRACT | Status: DC | PRN
  Administered 2022-01-12: 8 via RESPIRATORY_TRACT

## 2022-01-12 SURGICAL SUPPLY — 43 items
ADH SKN CLS APL DERMABOND .7 (GAUZE/BANDAGES/DRESSINGS) ×1
APL PRP STRL LF DISP 70% ISPRP (MISCELLANEOUS) ×1
APPLIER CLIP 9.375 MED OPEN (MISCELLANEOUS)
APR CLP MED 9.3 20 MLT OPN (MISCELLANEOUS)
BLADE SURG 15 STRL LF DISP TIS (BLADE) ×2 IMPLANT
BLADE SURG 15 STRL SS (BLADE) ×2
CANISTER SUC SOCK COL 7IN (MISCELLANEOUS) ×2 IMPLANT
CANISTER SUCT 1200ML W/VALVE (MISCELLANEOUS) ×2 IMPLANT
CHLORAPREP W/TINT 26 (MISCELLANEOUS) ×3 IMPLANT
CLIP APPLIE 9.375 MED OPEN (MISCELLANEOUS) IMPLANT
COVER BACK TABLE 60X90IN (DRAPES) ×3 IMPLANT
COVER MAYO STAND STRL (DRAPES) ×3 IMPLANT
COVER PROBE W GEL 5X96 (DRAPES) ×3 IMPLANT
DERMABOND ADVANCED (GAUZE/BANDAGES/DRESSINGS) ×1
DERMABOND ADVANCED .7 DNX12 (GAUZE/BANDAGES/DRESSINGS) ×2 IMPLANT
DRAPE LAPAROSCOPIC ABDOMINAL (DRAPES) ×3 IMPLANT
DRAPE UTILITY XL STRL (DRAPES) ×3 IMPLANT
ELECT COATED BLADE 2.86 ST (ELECTRODE) ×3 IMPLANT
ELECT REM PT RETURN 9FT ADLT (ELECTROSURGICAL) ×2
ELECTRODE REM PT RTRN 9FT ADLT (ELECTROSURGICAL) ×2 IMPLANT
GLOVE SURG ENC MOIS LTX SZ7.5 (GLOVE) ×5 IMPLANT
GLOVE SURG UNDER POLY LF SZ7 (GLOVE) ×2 IMPLANT
GOWN STRL REUS W/ TWL LRG LVL3 (GOWN DISPOSABLE) ×4 IMPLANT
GOWN STRL REUS W/TWL LRG LVL3 (GOWN DISPOSABLE) ×4
ILLUMINATOR WAVEGUIDE N/F (MISCELLANEOUS) IMPLANT
KIT MARKER MARGIN INK (KITS) ×3 IMPLANT
LIGHT WAVEGUIDE WIDE FLAT (MISCELLANEOUS) IMPLANT
NDL HYPO 25X1 1.5 SAFETY (NEEDLE) IMPLANT
NEEDLE HYPO 25X1 1.5 SAFETY (NEEDLE) ×2 IMPLANT
NS IRRIG 1000ML POUR BTL (IV SOLUTION) IMPLANT
PACK BASIN DAY SURGERY FS (CUSTOM PROCEDURE TRAY) ×3 IMPLANT
PENCIL SMOKE EVACUATOR (MISCELLANEOUS) ×3 IMPLANT
SLEEVE SCD COMPRESS KNEE MED (STOCKING) ×3 IMPLANT
SPIKE FLUID TRANSFER (MISCELLANEOUS) IMPLANT
SPONGE T-LAP 18X18 ~~LOC~~+RFID (SPONGE) ×3 IMPLANT
SUT MON AB 4-0 PC3 18 (SUTURE) ×3 IMPLANT
SUT SILK 2 0 SH (SUTURE) IMPLANT
SUT VICRYL 3-0 CR8 SH (SUTURE) ×3 IMPLANT
SYR CONTROL 10ML LL (SYRINGE) ×1 IMPLANT
TOWEL GREEN STERILE FF (TOWEL DISPOSABLE) ×3 IMPLANT
TRAY FAXITRON CT DISP (TRAY / TRAY PROCEDURE) ×3 IMPLANT
TUBE CONNECTING 20X1/4 (TUBING) ×2 IMPLANT
YANKAUER SUCT BULB TIP NO VENT (SUCTIONS) IMPLANT

## 2022-01-12 NOTE — Interval H&P Note (Signed)
History and Physical Interval Note: ? ?01/12/2022 ?10:39 AM ? ?Rachel Pearson  has presented today for surgery, with the diagnosis of RIGHT BREAST DISCORDANCE.  The various methods of treatment have been discussed with the patient and family. After consideration of risks, benefits and other options for treatment, the patient has consented to  Procedure(s): ?RIGHT BREAST LUMPECTOMY WITH RADIOACTIVE SEED LOCALIZATION X2 (Right) as a surgical intervention.  The patient's history has been reviewed, patient examined, no change in status, stable for surgery.  I have reviewed the patient's chart and labs.  Questions were answered to the patient's satisfaction.   ? ? ?Chevis Pretty III ? ? ?

## 2022-01-12 NOTE — Anesthesia Postprocedure Evaluation (Signed)
Anesthesia Post Note ? ?Patient: Rachel Pearson ? ?Procedure(s) Performed: RIGHT BREAST LUMPECTOMY WITH RADIOACTIVE SEED LOCALIZATION X2 (Right: Breast) ? ?  ? ?Patient location during evaluation: PACU ?Anesthesia Type: General ?Level of consciousness: awake ?Pain management: pain level controlled ?Vital Signs Assessment: post-procedure vital signs reviewed and stable ?Respiratory status: spontaneous breathing, nonlabored ventilation, respiratory function stable and patient connected to nasal cannula oxygen ?Cardiovascular status: blood pressure returned to baseline and stable ?Postop Assessment: no apparent nausea or vomiting ?Anesthetic complications: no ? ? ?No notable events documented. ? ?Last Vitals:  ?Vitals:  ? 01/12/22 1230 01/12/22 1257  ?BP: (!) 169/78 (!) 173/73  ?Pulse: 89 73  ?Resp: 16 16  ?Temp:  36.4 ?C  ?SpO2: 95% 94%  ?  ?Last Pain:  ?Vitals:  ? 01/12/22 1257  ?TempSrc:   ?PainSc: 0-No pain  ? ? ?  ?  ?  ?  ?  ?  ? ?Nycole Kawahara P Angeleen Horney ? ? ? ? ?

## 2022-01-12 NOTE — Anesthesia Procedure Notes (Signed)
Procedure Name: Intubation ?Date/Time: 01/12/2022 11:15 AM ?Performed by: Lavonia Dana, CRNA ?Pre-anesthesia Checklist: Patient identified, Emergency Drugs available, Suction available and Patient being monitored ?Patient Re-evaluated:Patient Re-evaluated prior to induction ?Oxygen Delivery Method: Circle system utilized ?Preoxygenation: Pre-oxygenation with 100% oxygen ?Induction Type: IV induction ?Ventilation: Oral airway inserted - appropriate to patient size and Two handed mask ventilation required ?Laryngoscope Size: Mac and 3 ?Grade View: Grade II ?Tube type: Oral ?Tube size: 7.0 mm ?Number of attempts: 1 ?Airway Equipment and Method: Stylet, Oral airway and Bite block ?Placement Confirmation: ETT inserted through vocal cords under direct vision, positive ETCO2 and breath sounds checked- equal and bilateral ?Secured at: 22 cm ?Tube secured with: Tape ?Dental Injury: Teeth and Oropharynx as per pre-operative assessment  ? ? ? ? ?

## 2022-01-12 NOTE — Op Note (Signed)
01/12/2022 ? ?11:44 AM ? ?PATIENT:  Rachel Pearson  56 y.o. female ? ?PRE-OPERATIVE DIAGNOSIS:  RIGHT BREAST DISCORDANCE ? ?POST-OPERATIVE DIAGNOSIS:  RIGHT BREAST DISCORDANCE ? ?PROCEDURE:  Procedure(s): ?RIGHT BREAST LUMPECTOMY WITH RADIOACTIVE SEED LOCALIZATION X2 (Right) ? ?SURGEON:  Surgeon(s) and Role: ?   Griselda Miner, MD - Primary ? ?PHYSICIAN ASSISTANT:  ? ?ASSISTANTS: none  ? ?ANESTHESIA:   local and general ? ?EBL:  minimal  ? ?BLOOD ADMINISTERED:none ? ?DRAINS: none  ? ?LOCAL MEDICATIONS USED:  MARCAINE    ? ?SPECIMEN:  Source of Specimen:  right breast tissue lateral and medial ? ?DISPOSITION OF SPECIMEN:  PATHOLOGY ? ?COUNTS:  YES ? ?TOURNIQUET:  * No tourniquets in log * ? ?DICTATION: .Dragon Dictation ? ?After informed consent was obtained the patient was brought to the operating room and placed in the supine position on the operating table.  After adequate induction of general anesthesia the patient's right breast was prepped with ChloraPrep, allowed to dry, and draped in usual sterile manner.  An appropriate timeout was performed.  Previously 2 I-125 seeds were placed in the upper inner quadrant of the right breast to mark areas of discordance.  The neoprobe was set to I-125 in the area of radioactivity was readily identified.  The area around this was infiltrated with quarter percent Marcaine.  A curvilinear incision was made along the upper inner edge of the areola of the right breast with a 15 blade knife.  The incision was carried through the skin and subcutaneous tissue sharply with the electrocautery.  Dissection was then carried towards the 2 radioactive seeds under the direction of the neoprobe.  Once I more closely approached each seed I removed a circular portion of breast tissue sharply with the electrocautery around the radioactive seed while checking the area of radioactivity frequently.  Once the 2 specimens were removed they were oriented with the appropriate paint colors.   Specimen radiographs of each showed the clip and seed to be within each specimen.  The specimens were then sent to pathology for further evaluation.  Hemostasis was achieved using the Bovie electrocautery.  The wound was irrigated with saline and infiltrated with more quarter percent Marcaine.  The deep layer of the incision was closed with interrupted 3-0 Vicryl stitches.  The skin was then closed with interrupted 4-0 Monocryl subcuticular stitches.  Dermabond dressings were applied.  The patient tolerated the procedure well.  At the end of the case all needle sponge and instrument counts were correct.  The patient was then awakened and taken to recovery in stable condition. ? ?PLAN OF CARE: Discharge to home after PACU ? ?PATIENT DISPOSITION:  PACU - hemodynamically stable. ?  ?Delay start of Pharmacological VTE agent (>24hrs) due to surgical blood loss or risk of bleeding: not applicable ? ?

## 2022-01-12 NOTE — H&P (Signed)
?REFERRING PHYSICIAN: Halford Decamp, NP ? ?PROVIDER: Lindell Noe, MD ? ?MRN: [id number] ?DOB: [birth year] ?Subjective  ? ?Chief Complaint: Breast Mass ? ? ?History of Present Illness: ?Rachel Pearson is a 56 y.o. female who is seen today as an office consultation at the request of Dr. Harle Stanford for evaluation of Breast Mass ?.  ? ?We are asked to see the patient in consultation by Dr. Halford Decamp to evaluate her for masses in the right breast. The patient is a 56 year old black female who recently went for routine screening mammogram. At that time she was found to have 2 areas of distortion in the inner aspect of the right breast. Both were biopsied and came back benign. The radiologist felt that both of these areas were discordant. She denies any family history of breast cancer. She is otherwise in pretty good health and does not smoke. ? ?Review of Systems: ?A complete review of systems was obtained from the patient. I have reviewed this information and discussed as appropriate with the patient. See HPI as well for other ROS. ? ?ROS  ? ?Medical History: ?Past Medical History:  ?Diagnosis Date  ? Amblyopia  ? Cataract cortical, senile  ? Fracture of lower end of tibia, unspecified laterality, open type III, initial encounter  ? Hypertension  ? Vision abnormalities  ? ?Patient Active Problem List  ?Diagnosis  ? Hypertension  ? Regular astigmatism, right eye  ? Mass of upper inner quadrant of right breast  ? ?Past Surgical History:  ?Procedure Laterality Date  ? LENS EYE SURGERY Left 2016  ?In [place]  ? CESAREAN SECTION  ? EXTRACTION CATARACT EXTRACAPSULAR Right 06/05/17 toric Dr. Rudene Anda  ? ? ?Allergies  ?Allergen Reactions  ? Meloxicam Hives  ? ?Current Outpatient Medications on File Prior to Visit  ?Medication Sig Dispense Refill  ? cholecalciferol, vitamin D3, 5,000 unit Tab Take by mouth.  ? fluticasone (FLONASE) 50 mcg/actuation nasal spray by Nasal route.  ? gabapentin (NEURONTIN) 100 MG  capsule Take by mouth.  ? loratadine (CLARITIN) 10 mg tablet Take by mouth.  ? losartan-hydrochlorothiazide (HYZAAR) 50-12.5 mg tablet 3  ? multivitamin with minerals (HAIR,SKIN AND NAILS ORAL) Take by mouth.  ? ferrous sulfate (SLOW RELEASE IRON ORAL) Take by mouth.  ? ?No current facility-administered medications on file prior to visit.  ? ?Family History  ?Problem Relation Age of Onset  ? Cataracts Mother  ? Diabetes Mother  ? High blood pressure (Hypertension) Mother  ? Cataracts Father  ? Diabetes Father  ? High blood pressure (Hypertension) Father  ? Glaucoma Father  ? Thyroid disease Sister  ? Diabetes Brother  ? Blindness Brother  ? High blood pressure (Hypertension) Brother  ? Sleep apnea Brother  ? Vision loss Brother  ? Thyroid disease Maternal Aunt  ? ? ?Social History  ? ?Tobacco Use  ?Smoking Status Never  ?Smokeless Tobacco Never  ? ? ?Social History  ? ?Socioeconomic History  ? Marital status: Married  ?Tobacco Use  ? Smoking status: Never  ? Smokeless tobacco: Never  ? ?Objective:  ? ?Vitals:  ?BP: (!) 150/82  ?Pulse: 101  ?Weight: (!) 116.1 kg (256 lb)  ?Height: 157.5 cm (5\' 2" )  ? ?Body mass index is 46.82 kg/m?. ? ?Physical Exam ?Vitals reviewed.  ?Constitutional:  ?General: She is not in acute distress. ?Appearance: Normal appearance.  ?HENT:  ?Head: Normocephalic and atraumatic.  ?Right Ear: External ear normal.  ?Left Ear: External ear normal.  ?Nose: Nose normal.  ?Mouth/Throat:  ?Mouth:  Mucous membranes are moist.  ?Pharynx: Oropharynx is clear.  ?Eyes:  ?General: No scleral icterus. ?Extraocular Movements: Extraocular movements intact.  ?Conjunctiva/sclera: Conjunctivae normal.  ?Pupils: Pupils are equal, round, and reactive to light.  ?Cardiovascular:  ?Rate and Rhythm: Normal rate and regular rhythm.  ?Pulses: Normal pulses.  ?Heart sounds: Normal heart sounds.  ?Pulmonary:  ?Effort: Pulmonary effort is normal. No respiratory distress.  ?Breath sounds: Normal breath sounds.  ?Abdominal:   ?General: Bowel sounds are normal.  ?Palpations: Abdomen is soft.  ?Tenderness: There is no abdominal tenderness.  ?Musculoskeletal:  ?General: No swelling, tenderness or deformity. Normal range of motion.  ?Cervical back: Normal range of motion and neck supple.  ?Skin: ?General: Skin is warm and dry.  ?Coloration: Skin is not jaundiced.  ?Neurological:  ?General: No focal deficit present.  ?Mental Status: She is alert and oriented to person, place, and time.  ?Psychiatric:  ?Mood and Affect: Mood normal.  ?Behavior: Behavior normal.  ? ? ? ?Breast: There is a palpable bruise in the upper inner aspect of the right breast. Other than this there is no other palpable mass in either breast. There is no palpable axillary, supraclavicular, or cervical lymphadenopathy. ? ?Labs, Imaging and Diagnostic Testing: ? ?Assessment and Plan:  ? ?Diagnoses and all orders for this visit: ? ?Mass of upper inner quadrant of right breast ? ? ? ?The patient appears to have 2 areas of discordance in the inner aspect of the right breast. Because of this the recommendation is to have both of these areas removed. I have discussed with her in detail the risks and benefits of the operation as well as some of the technical aspects including the use of a radioactive seed for localization and she understands and wishes to proceed. ?

## 2022-01-12 NOTE — Anesthesia Preprocedure Evaluation (Addendum)
Anesthesia Evaluation  ?Patient identified by MRN, date of birth, ID band ?Patient awake ? ? ? ?Reviewed: ?Allergy & Precautions, NPO status , Patient's Chart, lab work & pertinent test results ? ?Airway ?Mallampati: III ? ?TM Distance: >3 FB ?Neck ROM: Full ? ? ? Dental ? ?(+) Loose,  ?  ?Pulmonary ?neg pulmonary ROS,  ?  ?Pulmonary exam normal ?breath sounds clear to auscultation ? ? ? ? ? ? Cardiovascular ?hypertension, Pt. on medications ?Normal cardiovascular exam ?Rhythm:Regular Rate:Normal ? ?ECG: NSR, rate 71 ?  ?Neuro/Psych ? Neuromuscular disease negative psych ROS  ? GI/Hepatic ?negative GI ROS, Neg liver ROS,   ?Endo/Other  ?Morbid obesity ? Renal/GU ?negative Renal ROS  ? ?  ?Musculoskeletal ? ?(+) Arthritis ,  ? Abdominal ?  ?Peds ? Hematology ?negative hematology ROS ?(+)   ?Anesthesia Other Findings ?RIGHT BREAST DISCORDANCE ? Reproductive/Obstetrics ? ?  ? ? ? ? ? ? ? ? ? ? ? ? ? ?  ?  ? ? ? ? ? ? ? ?Anesthesia Physical ?Anesthesia Plan ? ?ASA: 3 ? ?Anesthesia Plan: General  ? ?Post-op Pain Management:   ? ?Induction: Intravenous ? ?PONV Risk Score and Plan: 3 and Ondansetron, Dexamethasone, Midazolam and Treatment may vary due to age or medical condition ? ?Airway Management Planned: LMA ? ?Additional Equipment:  ? ?Intra-op Plan:  ? ?Post-operative Plan: Extubation in OR ? ?Informed Consent: I have reviewed the patients History and Physical, chart, labs and discussed the procedure including the risks, benefits and alternatives for the proposed anesthesia with the patient or authorized representative who has indicated his/her understanding and acceptance.  ? ? ? ?Dental advisory given ? ?Plan Discussed with: CRNA ? ?Anesthesia Plan Comments:   ? ? ? ? ?Anesthesia Quick Evaluation ? ?

## 2022-01-12 NOTE — Transfer of Care (Signed)
Immediate Anesthesia Transfer of Care Note ? ?Patient: Rachel Pearson ? ?Procedure(s) Performed: RIGHT BREAST LUMPECTOMY WITH RADIOACTIVE SEED LOCALIZATION X2 (Right: Breast) ? ?Patient Location: PACU ? ?Anesthesia Type:General ? ?Level of Consciousness: awake, alert  and oriented ? ?Airway & Oxygen Therapy: Patient Spontanous Breathing and Patient connected to face mask oxygen ? ?Post-op Assessment: Report given to RN and Post -op Vital signs reviewed and stable ? ?Post vital signs: Reviewed and stable ? ?Last Vitals:  ?Vitals Value Taken Time  ?BP 186/83 01/12/22 1201  ?Temp    ?Pulse 98 01/12/22 1206  ?Resp 17 01/12/22 1206  ?SpO2 98 % 01/12/22 1206  ?Vitals shown include unvalidated device data. ? ?Last Pain:  ?Vitals:  ? 01/12/22 0953  ?TempSrc: Oral  ?PainSc: 0-No pain  ?   ? ?  ? ?Complications: No notable events documented. ?

## 2022-01-12 NOTE — Anesthesia Procedure Notes (Signed)
Procedure Name: LMA Insertion ?Date/Time: 01/12/2022 10:59 AM ?Performed by: Lauralyn Primes, CRNA ?Pre-anesthesia Checklist: Patient identified, Emergency Drugs available, Suction available and Patient being monitored ?Patient Re-evaluated:Patient Re-evaluated prior to induction ?Oxygen Delivery Method: Circle system utilized ?Preoxygenation: Pre-oxygenation with 100% oxygen ?Induction Type: IV induction ?Ventilation: Mask ventilation without difficulty ?LMA: LMA inserted ?LMA Size: 4.0 ?Number of attempts: 1 ?Airway Equipment and Method: Bite block ?Placement Confirmation: positive ETCO2 ?Tube secured with: Tape ?Dental Injury: Teeth and Oropharynx as per pre-operative assessment  ? ? ? ? ?

## 2022-01-12 NOTE — Discharge Instructions (Signed)

## 2022-01-15 ENCOUNTER — Encounter (HOSPITAL_BASED_OUTPATIENT_CLINIC_OR_DEPARTMENT_OTHER): Payer: Self-pay | Admitting: General Surgery

## 2022-01-15 LAB — SURGICAL PATHOLOGY

## 2022-01-17 ENCOUNTER — Encounter (HOSPITAL_COMMUNITY): Payer: Self-pay

## 2022-01-29 DIAGNOSIS — N6489 Other specified disorders of breast: Secondary | ICD-10-CM | POA: Insufficient documentation

## 2022-11-14 ENCOUNTER — Other Ambulatory Visit (HOSPITAL_COMMUNITY): Payer: Self-pay | Admitting: Internal Medicine

## 2022-11-14 DIAGNOSIS — Z1231 Encounter for screening mammogram for malignant neoplasm of breast: Secondary | ICD-10-CM

## 2022-11-21 ENCOUNTER — Ambulatory Visit (HOSPITAL_COMMUNITY)
Admission: RE | Admit: 2022-11-21 | Discharge: 2022-11-21 | Disposition: A | Discharge status: Home / Self Care | Payer: BC Managed Care – PPO | Source: Ambulatory Visit | Attending: Internal Medicine | Admitting: Internal Medicine

## 2022-11-21 DIAGNOSIS — Z1231 Encounter for screening mammogram for malignant neoplasm of breast: Secondary | ICD-10-CM | POA: Insufficient documentation

## 2023-10-04 ENCOUNTER — Encounter (HOSPITAL_COMMUNITY): Payer: Self-pay

## 2023-10-04 ENCOUNTER — Emergency Department (HOSPITAL_COMMUNITY): Payer: BC Managed Care – PPO

## 2023-10-04 ENCOUNTER — Emergency Department (HOSPITAL_COMMUNITY)
Admission: EM | Admit: 2023-10-04 | Discharge: 2023-10-05 | Disposition: A | Discharge status: Home / Self Care | Payer: BC Managed Care – PPO | Attending: Emergency Medicine | Admitting: Emergency Medicine

## 2023-10-04 ENCOUNTER — Other Ambulatory Visit: Payer: Self-pay

## 2023-10-04 DIAGNOSIS — M79601 Pain in right arm: Secondary | ICD-10-CM | POA: Insufficient documentation

## 2023-10-04 DIAGNOSIS — M7989 Other specified soft tissue disorders: Secondary | ICD-10-CM | POA: Insufficient documentation

## 2023-10-04 DIAGNOSIS — M25511 Pain in right shoulder: Secondary | ICD-10-CM | POA: Diagnosis present

## 2023-10-04 DIAGNOSIS — R930 Abnormal findings on diagnostic imaging of skull and head, not elsewhere classified: Secondary | ICD-10-CM | POA: Insufficient documentation

## 2023-10-04 DIAGNOSIS — Z79899 Other long term (current) drug therapy: Secondary | ICD-10-CM | POA: Insufficient documentation

## 2023-10-04 MED ORDER — IOHEXOL 300 MG/ML  SOLN
75.0000 mL | Freq: Once | INTRAMUSCULAR | Status: AC | PRN
  Administered 2023-10-05: 75 mL via INTRAVENOUS

## 2023-10-04 MED ORDER — FENTANYL CITRATE PF 50 MCG/ML IJ SOSY
50.0000 ug | PREFILLED_SYRINGE | Freq: Once | INTRAMUSCULAR | Status: AC
  Administered 2023-10-04: 50 ug via INTRAVENOUS
  Filled 2023-10-04: qty 1

## 2023-10-04 NOTE — ED Provider Notes (Signed)
[facility] Provider Note   CSN: [id number] Arrival date & time: 10/04/23  2028     History {Add pertinent medical, surgical, social history, OB history to HPI:1} Chief Complaint  Patient presents with   Extremity Pain    Rachel Pearson is a 57 y.o. female.  57 year old female who presents with her husband secondary to arm pain.  Patient states she got a flu shot and her right arm on Tuesday.  She states that she had the initial dull type of muscular pain after the flu shot for about 24 hours.  She went to work on Wednesday, the 18th, and while at work all of a sudden she had acute worsening of the pain in her right arm.  Mostly in the lateral right shoulder.  The pain quickly became so bad that she could not use that arm.  She has never had pain like this before.  She states that there is been no skin color changes or swelling of the arm.  No other trauma to the arm.  She states that she can use her fingers and hand and feels everything normally is just that their shoulder hurts so bad that she cannot use it.  She tried taking Tylenol and heat at home over the last 48 hours which only helped minimally.  She went to urgent care and then was sent here for further evaluation.  Patient has unknown strabismus but otherwise is neurologically at baseline.  Of note, while going through her neurologic exam the husband angrily cited the fact that she did not have a stroke and that they were already urgent care and that was ruled out and he just wanted her to be checked for hemorrhaging or other muscular issues.  Patient denies neck pain, fevers or other neurologic symptoms.  Patient does note a little bit of swelling of her fingers on her right hand she thinks is likely from being in a dependent position for so long.   Extremity Pain       Home Medications Prior to Admission medications   Medication Sig Start Date End Date Taking? Authorizing Provider   Cholecalciferol (VITAMIN D-3) 5000 UNITS TABS Take 5,000 Units by mouth daily.     [provider]  clotrimazole (LOTRIMIN) 1 % cream Apply 1 application topically 2 (two) times daily as needed (irritation). 06/03/19   Remus Loffler, PA-C  fluticasone (FLONASE) 50 MCG/ACT nasal spray Place 2 sprays into both nostrils daily as needed for allergies. 06/03/19   Remus Loffler, PA-C  gabapentin (NEURONTIN) 100 MG capsule Take 1 capsule (100 mg total) by mouth 3 (three) times daily. 11/30/19   Remus Loffler, PA-C  hydrocortisone 2.5 % cream Apply topically 2 (two) times daily. 11/30/19   Remus Loffler, PA-C  losartan (COZAAR) 50 MG tablet Take 1 tablet (50 mg total) by mouth daily. 07/08/20   Junie Spencer, FNP  Multiple Vitamins-Minerals (CENTURY-VITE PO) Take 1 tablet by mouth daily.     [provider]  oxyCODONE (ROXICODONE) 5 MG immediate release tablet Take 1 tablet (5 mg total) by mouth every 6 (six) hours as needed for severe pain. 01/12/22   Chevis Pretty III, MD  triamcinolone cream (KENALOG) 0.1 % Apply 1 application topically 2 (two) times daily. 05/24/20   Daryll Drown, NP      Allergies    Meloxicam    Review of Systems   Review of Systems  Physical Exam  Updated Vital Signs BP 135/75   Pulse 100   Temp 98.6 F (37 C)   Resp 17   Ht 5\' 2"  (1.575 m)   Wt 103 kg   SpO2 97%   BMI 41.52 kg/m  Physical Exam Vitals and nursing note reviewed.  Constitutional:      Appearance: She is well-developed.  HENT:     Head: Normocephalic and atraumatic.  Cardiovascular:     Rate and Rhythm: Normal rate and regular rhythm.     Comments: Equal radial pulses on both sides, symmetric color without any change in temperature in her hands.  Maybe mild edema in her right hand. Pulmonary:     Effort: No respiratory distress.     Breath sounds: No stridor.  Abdominal:     General: There is no distension.  Musculoskeletal:        General: Tenderness (Mild over her right  lateral deltoid) present.     Cervical back: Normal range of motion.     Comments: Patient with pain in her right deltoid with any active range of motion of her shoulder and elbow.  Passive range of motion has pretty significant pain with ABduction but not so much with movement forward or backwards.  Neurological:     Mental Status: She is alert.     Comments: Patient has a disconjugate gaze, symmetric facial features symmetric smile, symmetric palatal elevation, uvula midline, tongue midline, speech is clear.  She can squeeze both my fingers on both sides although little bit less on the right.  She does not try to use her bicep or deltoid on the right secondary to pain so cannot fully assess neurologic function there, sensation to light touch in upper extremities, lower extremities is symmetric and normal.  She can raise both legs off the bed symmetrically, dorsiflexion and plantarflexion are symmetric and 5 out of 5.  Mental status intact.     ED Results / Procedures / Treatments   Labs (all labs ordered are listed, but only abnormal results are displayed) Labs Reviewed  CBC WITH DIFFERENTIAL/PLATELET  COMPREHENSIVE METABOLIC PANEL    EKG None  Radiology No results found.  Procedures Procedures  {Document cardiac monitor, telemetry assessment procedure when appropriate:1}  Medications Ordered in ED Medications  iohexol (OMNIPAQUE) 300 MG/ML solution 75 mL (has no administration in time range)  fentaNYL (SUBLIMAZE) injection 50 mcg (50 mcg Intravenous Given 10/04/23 2350)    ED Course/ Medical Decision Making/ A&P   {   Click here for ABCD2, HEART and other calculatorsREFRESH Note before signing :1}                              Medical Decision Making Amount and/or Complexity of Data Reviewed Labs: ordered. Radiology: ordered.  Risk Prescription drug management.   Patient's husband adamant this is local to his shoulder.  Although it does seem that way it is a very  atypical story for flu shot especially 24 hours afterwards.  Possible that the flu shot was given intra-articular, damage to venous system or muscular hematoma could certainly present this way.  Cervical pathology is another consideration although she has no C-spine tenderness or injuries.  Will going check labs prior to getting contrast but will get a CT head without, CT cervical spine without contrast and then a CT of her shoulder with contrast to evaluate for any injuries there.  Will put her in a sling and give  her some pain medications.  I suspect that her CTs will probably be okay and will not have a likely explanation and will need a follow-up MRI with orthopedics.  ***  {Document critical care time when appropriate:1} {Document review of labs and clinical decision tools ie heart score, Chads2Vasc2 etc:1}  {Document your independent review of radiology images, and any outside records:1} {Document your discussion with family members, caretakers, and with consultants:1} {Document social determinants of health affecting pt's care:1} {Document your decision making why or why not admission, treatments were needed:1} Final Clinical Impression(s) / ED Diagnoses Final diagnoses:  None    Rx / DC Orders ED Discharge Orders     None

## 2023-10-04 NOTE — ED Triage Notes (Signed)
Recd flu shot in RIGHT upper arm on Thursday RIGHT upper arm pain with decreased ROM

## 2023-10-04 NOTE — ED Notes (Addendum)
Spouse has returned, sitting at bedside.

## 2023-10-04 NOTE — ED Notes (Signed)
ED Provider at bedside. 

## 2023-10-05 LAB — COMPREHENSIVE METABOLIC PANEL
ALT: 12 U/L (ref 0–44)
AST: 11 U/L — ABNORMAL LOW (ref 15–41)
Albumin: 3.8 g/dL (ref 3.5–5.0)
Alkaline Phosphatase: 71 U/L (ref 38–126)
Anion gap: 11 (ref 5–15)
BUN: 14 mg/dL (ref 6–20)
CO2: 24 mmol/L (ref 22–32)
Calcium: 9.5 mg/dL (ref 8.9–10.3)
Chloride: 100 mmol/L (ref 98–111)
Creatinine, Ser: 0.61 mg/dL (ref 0.44–1.00)
GFR, Estimated: >60 mL/min (ref >60)
Glucose, Bld: 123 mg/dL — ABNORMAL HIGH (ref 70–99)
Potassium: 3.9 mmol/L (ref 3.5–5.1)
Sodium: 135 mmol/L (ref 135–145)
Total Bilirubin: 1.1 mg/dL (ref <1.2)
Total Protein: 8.6 g/dL — ABNORMAL HIGH (ref 6.5–8.1)

## 2023-10-05 LAB — CBC WITH DIFFERENTIAL/PLATELET
Abs Immature Granulocytes: 0.06 10*3/uL (ref 0.00–0.07)
Basophils Absolute: 0.1 10*3/uL (ref 0.0–0.1)
Basophils Relative: 0 %
Eosinophils Absolute: 0 10*3/uL (ref 0.0–0.5)
Eosinophils Relative: 0 %
HCT: 34.2 % — ABNORMAL LOW (ref 36.0–46.0)
Hemoglobin: 10.3 g/dL — ABNORMAL LOW (ref 12.0–15.0)
Immature Granulocytes: 0 %
Lymphocytes Relative: 14 %
Lymphs Abs: 2.4 10*3/uL (ref 0.7–4.0)
MCH: 22.2 pg — ABNORMAL LOW (ref 26.0–34.0)
MCHC: 30.1 g/dL (ref 30.0–36.0)
MCV: 73.5 fL — ABNORMAL LOW (ref 80.0–100.0)
Monocytes Absolute: 1.7 10*3/uL — ABNORMAL HIGH (ref 0.1–1.0)
Monocytes Relative: 10 %
Neutro Abs: 13.2 10*3/uL — ABNORMAL HIGH (ref 1.7–7.7)
Neutrophils Relative %: 76 %
Platelets: 398 10*3/uL (ref 150–400)
RBC: 4.65 MIL/uL (ref 3.87–5.11)
RDW: 14.1 % (ref 11.5–15.5)
WBC: 17.4 10*3/uL — ABNORMAL HIGH (ref 4.0–10.5)
nRBC: 0 % (ref 0.0–0.2)

## 2023-10-05 MED ORDER — ACETAMINOPHEN 325 MG PO TABS: 650.0000 mg | ORAL_TABLET | Freq: Three times a day (TID) | ORAL | 0 refills | Status: AC

## 2023-10-05 MED ORDER — LIDOCAINE 5 % EX PTCH
1.0000 | MEDICATED_PATCH | CUTANEOUS | Status: DC
  Administered 2023-10-05: 1 via TRANSDERMAL
  Filled 2023-10-05: qty 1

## 2023-10-05 MED ORDER — 1ST MEDX-PATCH/ LIDOCAINE 4-0.025-5-20 % EX PTCH: 1.0000 | MEDICATED_PATCH | Freq: Every day | CUTANEOUS | 0 refills | Status: AC | PRN

## 2023-10-05 MED ORDER — OXYCODONE HCL 5 MG PO TABS: 5.0000 mg | ORAL_TABLET | Freq: Four times a day (QID) | ORAL | 0 refills | Status: AC | PRN

## 2023-10-05 MED ORDER — METHOCARBAMOL 500 MG PO TABS: 500.0000 mg | ORAL_TABLET | Freq: Three times a day (TID) | ORAL | 0 refills | Status: AC | PRN

## 2023-10-05 MED ORDER — OXYCODONE-ACETAMINOPHEN 5-325 MG PO TABS: 1.0000 | ORAL_TABLET | Freq: Four times a day (QID) | ORAL | 0 refills | Status: AC | PRN

## 2023-10-05 NOTE — ED Notes (Signed)
Pt in CT at this time- spouse stepped out of ED

## 2023-10-07 MED FILL — Oxycodone w/ Acetaminophen Tab 5-325 MG: ORAL | Qty: 6 | Status: AC

## 2023-10-31 ENCOUNTER — Other Ambulatory Visit (HOSPITAL_COMMUNITY): Payer: Self-pay | Admitting: Internal Medicine

## 2023-10-31 DIAGNOSIS — Z1231 Encounter for screening mammogram for malignant neoplasm of breast: Secondary | ICD-10-CM

## 2023-11-25 ENCOUNTER — Ambulatory Visit (HOSPITAL_COMMUNITY): Payer: BC Managed Care – PPO

## 2023-11-25 ENCOUNTER — Ambulatory Visit (HOSPITAL_COMMUNITY)
Admission: RE | Admit: 2023-11-25 | Discharge: 2023-11-25 | Disposition: A | Discharge status: Home / Self Care | Payer: BC Managed Care – PPO | Source: Ambulatory Visit | Attending: Internal Medicine | Admitting: Internal Medicine

## 2023-11-25 DIAGNOSIS — Z1231 Encounter for screening mammogram for malignant neoplasm of breast: Secondary | ICD-10-CM | POA: Diagnosis present
